# Patient Record
Sex: Male | Born: 1935 | Race: White | Hispanic: No | Marital: Married | Smoking: Former smoker
Health system: Southern US, Community
[De-identification: ages and names within clinical notes are randomized; demographics above are authoritative.]

## PROBLEM LIST (undated history)

## (undated) DIAGNOSIS — R202 Paresthesia of skin: Secondary | ICD-10-CM

## (undated) DIAGNOSIS — I129 Hypertensive chronic kidney disease with stage 1 through stage 4 chronic kidney disease, or unspecified chronic kidney disease: Secondary | ICD-10-CM

## (undated) DIAGNOSIS — I10 Essential (primary) hypertension: Secondary | ICD-10-CM

## (undated) DIAGNOSIS — N400 Enlarged prostate without lower urinary tract symptoms: Secondary | ICD-10-CM

## (undated) DIAGNOSIS — K279 Peptic ulcer, site unspecified, unspecified as acute or chronic, without hemorrhage or perforation: Secondary | ICD-10-CM

## (undated) DIAGNOSIS — N289 Disorder of kidney and ureter, unspecified: Secondary | ICD-10-CM

## (undated) DIAGNOSIS — I2510 Atherosclerotic heart disease of native coronary artery without angina pectoris: Secondary | ICD-10-CM

## (undated) DIAGNOSIS — E785 Hyperlipidemia, unspecified: Secondary | ICD-10-CM

## (undated) HISTORY — DX: Hypertensive chronic kidney disease with stage 1 through stage 4 chronic kidney disease, or unspecified chronic kidney disease: I12.9

## (undated) HISTORY — DX: Paresthesia of skin: R20.2

## (undated) HISTORY — DX: Atherosclerotic heart disease of native coronary artery without angina pectoris: I25.10

## (undated) HISTORY — DX: Peptic ulcer, site unspecified, unspecified as acute or chronic, without hemorrhage or perforation: K27.9

## (undated) HISTORY — DX: Disorder of kidney and ureter, unspecified: N28.9

## (undated) HISTORY — DX: Essential (primary) hypertension: I10

## (undated) HISTORY — DX: Hyperlipidemia, unspecified: E78.5

## (undated) HISTORY — DX: Benign prostatic hyperplasia without lower urinary tract symptoms: N40.0

---

## 2004-03-10 ENCOUNTER — Encounter: Admission: RE | Admit: 2004-03-10 | Discharge: 2004-03-10 | Payer: Self-pay | Admitting: Nephrology

## 2004-06-03 ENCOUNTER — Ambulatory Visit (HOSPITAL_COMMUNITY): Admission: RE | Admit: 2004-06-03 | Discharge: 2004-06-03 | Payer: Self-pay | Admitting: Nephrology

## 2005-04-02 ENCOUNTER — Ambulatory Visit: Payer: Self-pay | Admitting: Cardiology

## 2005-04-07 ENCOUNTER — Ambulatory Visit: Payer: Self-pay

## 2006-04-28 ENCOUNTER — Ambulatory Visit: Payer: Self-pay | Admitting: Cardiology

## 2006-09-22 ENCOUNTER — Ambulatory Visit: Payer: Self-pay | Admitting: Gastroenterology

## 2006-10-06 ENCOUNTER — Encounter (INDEPENDENT_AMBULATORY_CARE_PROVIDER_SITE_OTHER): Payer: Self-pay | Admitting: Specialist

## 2006-10-06 ENCOUNTER — Ambulatory Visit: Payer: Self-pay | Admitting: Gastroenterology

## 2006-10-20 ENCOUNTER — Ambulatory Visit: Payer: Self-pay | Admitting: Gastroenterology

## 2006-10-20 ENCOUNTER — Encounter (INDEPENDENT_AMBULATORY_CARE_PROVIDER_SITE_OTHER): Payer: Self-pay | Admitting: *Deleted

## 2006-11-09 ENCOUNTER — Ambulatory Visit: Payer: Self-pay | Admitting: Gastroenterology

## 2006-11-09 LAB — CONVERTED CEMR LAB: Folate: >20 ng/mL

## 2006-11-22 ENCOUNTER — Ambulatory Visit: Payer: Self-pay | Admitting: Gastroenterology

## 2006-11-22 LAB — CONVERTED CEMR LAB
OCCULT 2: NEGATIVE
OCCULT 3: NEGATIVE
OCCULT 4: NEGATIVE
OCCULT 5: NEGATIVE

## 2007-01-12 ENCOUNTER — Ambulatory Visit: Payer: Self-pay

## 2007-03-08 ENCOUNTER — Encounter (HOSPITAL_COMMUNITY): Admission: RE | Admit: 2007-03-08 | Discharge: 2007-05-30 | Payer: Self-pay | Admitting: Nephrology

## 2007-04-20 ENCOUNTER — Ambulatory Visit: Payer: Self-pay | Admitting: Cardiology

## 2007-07-05 ENCOUNTER — Ambulatory Visit (HOSPITAL_COMMUNITY): Admission: RE | Admit: 2007-07-05 | Discharge: 2007-07-05 | Payer: Self-pay | Admitting: Nephrology

## 2007-10-06 ENCOUNTER — Ambulatory Visit (HOSPITAL_COMMUNITY): Admission: RE | Admit: 2007-10-06 | Discharge: 2007-10-06 | Payer: Self-pay | Admitting: Nephrology

## 2007-10-25 ENCOUNTER — Ambulatory Visit: Payer: Self-pay | Admitting: Gastroenterology

## 2007-10-27 ENCOUNTER — Ambulatory Visit: Payer: Self-pay | Admitting: Gastroenterology

## 2008-04-11 ENCOUNTER — Ambulatory Visit (HOSPITAL_COMMUNITY): Admission: RE | Admit: 2008-04-11 | Discharge: 2008-04-11 | Payer: Self-pay | Admitting: Nephrology

## 2008-04-18 ENCOUNTER — Ambulatory Visit: Payer: Self-pay | Admitting: Cardiology

## 2008-04-19 ENCOUNTER — Ambulatory Visit: Payer: Self-pay

## 2008-05-10 ENCOUNTER — Ambulatory Visit (HOSPITAL_COMMUNITY): Admission: RE | Admit: 2008-05-10 | Discharge: 2008-05-10 | Payer: Self-pay | Admitting: Nephrology

## 2008-11-06 ENCOUNTER — Encounter (HOSPITAL_COMMUNITY): Admission: RE | Admit: 2008-11-06 | Discharge: 2009-02-04 | Payer: Self-pay | Admitting: Nephrology

## 2009-04-25 ENCOUNTER — Encounter: Payer: Self-pay | Admitting: Cardiology

## 2009-04-25 ENCOUNTER — Ambulatory Visit: Payer: Self-pay

## 2009-06-18 DIAGNOSIS — N259 Disorder resulting from impaired renal tubular function, unspecified: Secondary | ICD-10-CM | POA: Insufficient documentation

## 2009-06-18 DIAGNOSIS — I129 Hypertensive chronic kidney disease with stage 1 through stage 4 chronic kidney disease, or unspecified chronic kidney disease: Secondary | ICD-10-CM

## 2009-06-18 DIAGNOSIS — I10 Essential (primary) hypertension: Secondary | ICD-10-CM | POA: Insufficient documentation

## 2009-06-18 DIAGNOSIS — E785 Hyperlipidemia, unspecified: Secondary | ICD-10-CM

## 2009-06-18 DIAGNOSIS — K279 Peptic ulcer, site unspecified, unspecified as acute or chronic, without hemorrhage or perforation: Secondary | ICD-10-CM | POA: Insufficient documentation

## 2009-06-18 DIAGNOSIS — I2510 Atherosclerotic heart disease of native coronary artery without angina pectoris: Secondary | ICD-10-CM | POA: Insufficient documentation

## 2009-06-18 DIAGNOSIS — R209 Unspecified disturbances of skin sensation: Secondary | ICD-10-CM

## 2009-06-18 DIAGNOSIS — K922 Gastrointestinal hemorrhage, unspecified: Secondary | ICD-10-CM | POA: Insufficient documentation

## 2009-06-18 DIAGNOSIS — Z87898 Personal history of other specified conditions: Secondary | ICD-10-CM

## 2009-06-19 ENCOUNTER — Ambulatory Visit: Payer: Self-pay | Admitting: Cardiology

## 2009-06-20 ENCOUNTER — Ambulatory Visit (HOSPITAL_COMMUNITY): Admission: RE | Admit: 2009-06-20 | Discharge: 2009-06-20 | Payer: Self-pay | Admitting: Nephrology

## 2010-05-06 ENCOUNTER — Encounter: Payer: Self-pay | Admitting: Cardiology

## 2010-05-06 DIAGNOSIS — I6529 Occlusion and stenosis of unspecified carotid artery: Secondary | ICD-10-CM

## 2010-05-07 ENCOUNTER — Encounter: Payer: Self-pay | Admitting: Cardiology

## 2010-05-07 ENCOUNTER — Ambulatory Visit: Payer: Self-pay

## 2010-05-15 ENCOUNTER — Ambulatory Visit (HOSPITAL_COMMUNITY): Admission: RE | Admit: 2010-05-15 | Discharge: 2010-05-15 | Payer: Self-pay | Admitting: Nephrology

## 2010-11-20 NOTE — Miscellaneous (Signed)
Summary: Orders Update  Clinical Lists Changes  Problems: Added new problem of CAROTID ARTERY DISEASE (ICD-433.10) Orders: Added new Test order of Carotid Duplex (Carotid Duplex) - Signed 

## 2010-12-31 ENCOUNTER — Ambulatory Visit (HOSPITAL_COMMUNITY): Payer: Medicare Other | Attending: Nephrology

## 2010-12-31 DIAGNOSIS — D509 Iron deficiency anemia, unspecified: Secondary | ICD-10-CM | POA: Insufficient documentation

## 2011-02-14 ENCOUNTER — Encounter: Payer: Self-pay | Admitting: Cardiology

## 2011-02-18 ENCOUNTER — Ambulatory Visit (INDEPENDENT_AMBULATORY_CARE_PROVIDER_SITE_OTHER): Payer: Medicare Other | Admitting: Cardiology

## 2011-02-18 ENCOUNTER — Encounter: Payer: Self-pay | Admitting: Cardiology

## 2011-02-18 VITALS — BP 121/61 | HR 64 | Ht 66.0 in | Wt 184.0 lb

## 2011-02-18 DIAGNOSIS — E785 Hyperlipidemia, unspecified: Secondary | ICD-10-CM

## 2011-02-18 DIAGNOSIS — I6529 Occlusion and stenosis of unspecified carotid artery: Secondary | ICD-10-CM

## 2011-02-18 DIAGNOSIS — I2510 Atherosclerotic heart disease of native coronary artery without angina pectoris: Secondary | ICD-10-CM

## 2011-02-18 DIAGNOSIS — I10 Essential (primary) hypertension: Secondary | ICD-10-CM

## 2011-02-18 DIAGNOSIS — N259 Disorder resulting from impaired renal tubular function, unspecified: Secondary | ICD-10-CM

## 2011-02-18 MED ORDER — ATORVASTATIN CALCIUM 40 MG PO TABS: 80.0000 mg | ORAL_TABLET | Freq: Every day | ORAL | Status: DC

## 2011-02-18 NOTE — Progress Notes (Signed)
HPI The patient has been doing well since I last saw him. He remains active doing yard work. With this level of activity he denies any chest discomfort, neck or arm discomfort. He has had no palpitations, presyncope or syncope. He denies any shortness of breath, PND or orthopnea. He has had no weight gain or edema. He has had no cough fevers or chills. I do note that he was told by Dr. Christell Constant to increase his Lipitor recently but he didn't do this as he needed a new prescription. I reviewed his lipid profile.  No Known Allergies  Current Outpatient Prescriptions  Medication Sig Dispense Refill  . aspirin 81 MG tablet Take 81 mg by mouth daily.        . Calcium Carbonate-Vitamin D (CALCIUM + D PO) Take by mouth. 1 TAB DAILY  (500MG )       . Epoetin Alfa (PROCRIT IJ) Inject as directed. 1 SHOT EVERY EVERY 2 - 3 WEEKS       . furosemide (LASIX) 40 MG tablet Take 40 mg by mouth 2 (two) times daily.        Marland Kitchen allopurinol (ZYLOPRIM) 100 MG tablet 1 TAB TWICE A DAY      . atorvastatin (LIPITOR) 40 MG tablet 1 TAB QD      . calcitRIOL (ROCALTROL) 0.5 MCG capsule 1 TAB EVERY OTHER DAY      . metoprolol (TOPROL-XL) 50 MG 24 hr tablet 1/2 TAB TWICE A DAY      . PRILOSEC OTC 20 MG tablet 2 TABS DAILY        Past Medical History  Diagnosis Date  . Paresthesia   . Benign prostatic hypertrophy   . Hypertension   . Dyslipidemia   . Renal insufficiency   . Nephrosclerosis   . Gastrointestinal hemorrhage   . PUD (peptic ulcer disease)   . CAD (coronary artery disease)     (status post PTCA of the LAD and right coronary artery by Dr. Earl Gala.  He had a repeatcatheterization in 1984 by Dr. Juanda Chance.  In 1995, he was found to have anoccluded LAD with 50-70% proximal circumflex stenosis.  He had 50%,followed by 60%, followed by 50% right coronary artery stenosis.  His EF was 50%)    No past surgical history on file.  ROS:  As stated in the HPI and negative for all other systems.  PHYSICAL EXAM BP  121/61  Pulse 64  Ht 5\' 6"  (1.676 m)  Wt 184 lb (83.462 kg)  BMI 29.70 kg/m2 GENERAL:  Well appearing HEENT:  Pupils equal round and reactive, fundi not visualized, oral mucosa unremarkable, poor dentition NECK:  No jugular venous distention, waveform within normal limits, carotid upstroke brisk and symmetric, right bruit, no thyromegaly LYMPHATICS:  No cervical, inguinal adenopathy LUNGS:  Clear to auscultation bilaterally BACK:  No CVA tenderness CHEST:  Unremarkable HEART:  PMI not displaced or sustained,S1 and S2 within normal limits, no S3, no S4, no clicks, no rubs, soft apical systolic murmur ABD:  Flat, positive bowel sounds normal in frequency in pitch, no bruits, no rebound, no guarding, no midline pulsatile mass, no hepatomegaly, no splenomegaly EXT:  2 plus pulses throughout, no edema, no cyanosis no clubbing SKIN:  No rashes no nodules NEURO:  Cranial nerves II through XII grossly intact, motor grossly intact throughout PSYCH:  Cognitively intact, oriented to person place and time   EKG:  Sinus rhythm, rate 64, right bundle branch block, no change from previous  ASSESSMENT  AND PLAN

## 2011-02-18 NOTE — Assessment & Plan Note (Signed)
I reviewed his creatinine which was 2.16 recently. She is followed by nephrology. This is relatively stable slightly increased.

## 2011-02-18 NOTE — Assessment & Plan Note (Signed)
I did review his lipids. He is to increase his Lipitor to 80 mg Monday Wednesday and Friday and 40 mg the other days.

## 2011-02-18 NOTE — Assessment & Plan Note (Signed)
He will have a followup carotid Dopplers in July.

## 2011-02-18 NOTE — Assessment & Plan Note (Signed)
Though it has been many years since his catheterization and his last stress test was in 2006 I do not plan on repeat testing as he is completely asymptomatic. With his renal insufficiency we would like to avoid any invasive evaluation and I will be guided by symptoms in further determining the need for stress testing or catheterization in the future. He will of course continue aggressive risk reduction.

## 2011-02-18 NOTE — Assessment & Plan Note (Signed)
The blood pressure is at target. No change in medications is indicated. We will continue with therapeutic lifestyle changes (TLC).  

## 2011-02-18 NOTE — Patient Instructions (Signed)
Increase Lipitor to 80 mg on Monday, Wednesday and Friday 40 mg all other days Follow up with Dr Antoine Poche in [place] in 18 months Continue all other medications as ordered

## 2011-03-03 NOTE — Assessment & Plan Note (Signed)
[facility]                            CARDIOLOGY OFFICE NOTE   ALMANDO, BRAWLEY                      MRN:          [id number]  DATE:04/18/2008                            DOB:          1925-08-31    PRIMARY CARE PHYSICIAN:  Ernestina Penna, MD.   REASON FOR PRESENTATION:  Evaluate patient with coronary disease.   HISTORY OF PRESENT ILLNESS:  The patient just turned 77 years old.  He  has done well since I last saw him.  He was thinking about having his  shoulder operated on for rotator cuff repair, but he decided not to do  this.  He does have progressive renal insufficiency and is seeing Dr.  Darrick Penna.  He has had no cardiovascular complaints.  He remains  somewhat active and denies any chest pressure or neck or arm discomfort.  He has had no palpitations, presyncope, or syncope.   PAST MEDICAL HISTORY:  1. Coronary artery disease (status post PTCA of the LAD and right      coronary artery by Dr. Earl Gala.  He had repeat catheterization      in 1984 by Dr. Juanda Chance demonstrating 30% right coronary stenosis and      recurrent LAD 90% stenosis.  He was managed medically.      Catheterization in 1995 demonstrated an occluded LAD with 50-70%      proximal circumflex stenosis.  There was 50% followed by 60%      followed by 50% right coronary artery stenosis.  The EF was 60%).  2. Peptic ulcer disease.  3. Gastrointestinal bleeding.  4. Nephrosclerosis with progressive renal insufficiency.  5. Hyperlipidemia.  6. Hypertension.  7. Osteoporosis.  8. Kyphoscoliosis.  9. Benign prostatic hypertrophy.  10.Paresthesias.   ALLERGIES:  None.   MEDICATIONS:  1. Niaspan 500 mg t.i.d.  2. Aspirin 81 mg daily.  3. Calcium.  4. Prilosec 20 mg b.i.d.  5. Calcitriol.  6. Aranesp.  7. Allopurinol 100 mg b.i.d.  8. Procrit.  9. Metoprolol 50 mg daily.  10.Lipitor 40 mg daily.  11.Furosemide 40 mg b.i.d.  12.Klor-Con 10 mEq daily.   REVIEW OF  SYSTEMS:  As stated in the HPI and otherwise negative for  other systems.   PHYSICAL EXAMINATION:  The patient is in no distress.  Blood pressure 118/64, heart rate 78 and regular, weight 177 pounds, and  body mass index 26.  HEENT:  Eyes are unremarkable.  Pupils are equal, round, and reactive to  light.  Fundi not visualized.  Oral mucosa unremarkable.  NECK:  Bilateral right-greater-than-left carotid bruits, no thyromegaly.  LYMPHATICS:  No adenopathy.  LUNGS:  Clear to auscultation bilaterally.  BACK:  Kyphoscoliosis.  HEART:  PMI not displaced or sustained, S1 and S2 within normal limits.  No S3.  No S4.  No clicks, no rubs, and no murmurs.  ABDOMEN:  Flat, positive bowel sounds, normal in frequency and pitch, no  bruits, no rebound, and no guarding.  No midline pulsatile mass and no  organomegaly.  SKIN:  No rashes, no nodules.  EXTREMITIES:  Pulses 2+  throughout, trace bilateral lower extremity  edema.  No cyanosis, no clubbing.  NEURO: Grossly intact throughout.   EKG:  Sinus rhythm, rate 78, right axis deviation, right bundle-branch  block, no acute ST-T wave changes, no change from previous EKGs.   ASSESSMENT AND PLAN:  1. Coronary artery disease.  The patient has had no new symptoms.  He      has had his last stress perfusion study in 2006.  At this point, I      would not pursue another study in the absence of symptoms.  I would      have to be hard pressed to consider catheterization given his renal      insufficiency.  Rather, we will continue to pursue aggressive      secondary risk reduction.  2. Carotid stenosis.  The patient has some nonobstructive carotid      stenosis and is due for repeat carotid Doppler, and I will arrange      this.  3. Hypertension.  Blood pressure is very well controlled, and he can      continue on the medications as listed.  4. Dyslipidemia, had a reasonable lipid profile, followed by Dr.      Christell Constant.  5. Renal insufficiency.  He is  followed by Dr. Darrick Penna.   FOLLOWUP:  I will see him back in 1 year or sooner if needed.     Rollene Rotunda, MD, [facility]  Electronically Signed   JH/MedQ  DD: 04/18/2008  DT: 04/19/2008  Job #: [id number]   cc:   Ernestina Penna, M.D.

## 2011-03-03 NOTE — Assessment & Plan Note (Signed)
[facility]                         GASTROENTEROLOGY OFFICE NOTE   Aaron Hahn, Aaron Hahn                      MRN:          [id number]  DATE:10/25/2007                            DOB:          [birth year]    Aaron Hahn is an 77 year old white male with chronic renal  insufficiency followed by Dr. Darrick Penna and Dr. Vernon Prey.  He has  chronic normochromic normocytic anemia associated with his chronic renal  insufficiency, but also has repeatedly guaiac positive stools.  He is on  weekly Aranesp injections, Procrit 4000 mg a week, and oral iron  supplements in addition to Prilosec 20 mg twice a day.   He has had multiple endoscopic exams and has had recurrent peptic ulcer  disease and duodenitis, and is chronic on PPI therapy.  It is of note  that he continues to take an 81 mg aspirin tablet.  Last endoscopy and  colonoscopy in January 2008 and were otherwise unremarkable, except for  some small incidental polyps and some diverticulosis, and also internal  hemorrhoids.  The patient will occasionally have some bright red blood  if he wipes too hard.   Denies abdominal pain, dyspepsia, melena, or hematochezia, anorexia,  weight loss, or other medical problems.  Recent hemoglobin in Dr.  Deterding's office was 11.2, and he had a serum ferritin of 54, and iron  saturation of 15%.  Electrolyte profile was normal.   Weight 180 pounds, blood pressure 134/64, pulse 76 and regular.  He is an elderly-appearing white male in no acute distress.  Appearing  older than stated age.  I could not appreciate stigmata of chronic liver disease.  ABDOMEN:  No hepatosplenomegaly, abdominal masses, or tenderness.  Bowel  sounds were normal.  Inspection of the rectum was unremarkable without fissures, fistulae, or  hemorrhoids.  Rectal exam showed no masses or tenderness with soft brown  stools.  Guaiac negative.   ASSESSMENT:  Aaron Hahn has intermittently positive  stools, probably  related to his salicylate use.  He certainly has had no major  significant GI bleeding in the last several years despite taking aspirin  therapy.  His workup has been complete, except for small bowel  examination.   RECOMMENDATIONS:  I have gone ahead and set Aaron Hahn up for entero-  capsule camera exam of his gut.  We will continue other medications in  the interim and proceed accordingly.     Vania Rea. Jarold Motto, MD, [facility], FAGA  Electronically Signed    DRP/MedQ  DD: 10/25/2007  DT: 10/25/2007  Job #: [id number]   cc:   Fayrene Fearing L. Deterding, M.D.  Ernestina Penna, M.D.

## 2011-03-03 NOTE — Assessment & Plan Note (Signed)
[facility]                            CARDIOLOGY OFFICE NOTE   CAYLOR, TALLARICO                      MRN:          [id number]  DATE:04/20/2007                            DOB:          [birth year]    PRIMARY:  Ernestina Penna, M.D.   REASON FOR PRESENTATION:  Evaluate the patient with coronary artery  disease.   HISTORY OF PRESENT ILLNESS:  The patient is now 77 years old.  He  presents for yearly followup.  Since I last saw him, he has done  relatively well.  His renal function has been relatively stable and  followed quarterly by Dr. Darrick Penna.  He has had problems with his right  shoulder and is unable to lift his arm.  This is his biggest complaint.  Despite this, he remains active, doing such things as yard work, riding  a Surveyor, mining, and Radiographer, therapeutic for trim.  With this, he denies any  chest discomfort, neck discomfort, arm discomfort, activity-induced  nausea, vomiting, or excessive diaphoresis.  He has had no palpitations,  pre-syncope, or syncope.  He has had no PND or orthopnea.   PAST MEDICAL HISTORY:  Coronary artery disease status post PTCA of the  LAD and right coronary artery with Dr. Earl Gala.  Repeat  catheterization in 1984 by Dr. Juanda Chance demonstrated 30% right stenosis,  recurrent 90% stenosis of the LAD.  He was managed medically.  Catheterization in 1995 demonstrated an occluded LAD 50-70% proximal  circumflex stenosis, 50% followed by 60% followed by 50% right coronary  artery stenosis.  His EF was 60%.  Peptic ulcer disease.  Gastrointestinal bleeding.  Nephrosclerosis with progressive renal  insufficiency.  Hyperlipidemia.  Hypertension.  Osteoporosis.  Kyphoscoliosis.  Benign prostatic hypertrophy.  Paresthesias.   ALLERGIES:  None.   CURRENT MEDICATIONS:  1. Metoprolol 50 mg daily.  2. Niaspan 500 mg t.i.d.  3. Lovastatin 40 mg daily.  4. Aspirin 81 mg daily.  5. Iron 225 mg daily.  6. Furosemide 40 mg  daily.  7. Calcium.  8. Prilosec.  9. Calcitriol.  10.Aranesp every other week.   REVIEW OF SYSTEMS:  As stated in the HPI and otherwise negative for  other systems.   PHYSICAL EXAMINATION:  The patient is in no distress.  Blood pressure 132/78, heart rate 66 and regular, weight 175 pounds.  Body mass index 26.  HEENT:  Eyelids unremarkable.  Pupils are equal, round, and reactive to  light and accommodation.  Fundi not visualized.  Oral mucosa  unremarkable.  NECK:  No jugular venous distension, wave form within normal limits,  carotid upstroke brisk and symmetric, right greater than left carotid  bruit, no thyromegaly.  LYMPHATICS:  No cervical, axillary, or inguinal adenopathy.  LUNGS:  Clear to auscultation bilaterally.  BACK:  No costovertebral angle tenderness.  CHEST:  Unremarkable.  HEART:  PMI not displaced or sustained, S1 and S2 within normal limits,  no S3, no S4, no clicks, rubs, murmurs.  ABDOMEN:  Mildly obese, positive bowel sounds, normal in frequency and  pitch, no bruits, rebound, guarding.  No  midline pulsatile masses,  hepatomegaly, splenomegaly.  SKIN:  No rashes, no nodules.  EXTREMITIES:  With 2+ pulses upper pulses, 2+ femorals with right  greater than left bruit, absent dorsalis pedis and posterior tibialis on  the right, 1+ dorsalis and posterior tibialis on the left, no edema,  cyanosis, clubbing.  NEURO:  Oriented to person, place, and time, cranial nerves 2-12 grossly  intact, motor grossly intact.   ELECTROCARDIOGRAM:  Sinus rhythm.  Right bundle branch block, complete  (was incomplete at the previous EKG).  Inferolateral T wave inversions.  Possible repolarization versus ischemia new compared to previous.   ASSESSMENT AND PLAN:  1. Coronary artery disease.  The patient has extensive coronary artery      disease as described.  However, he is having no symptoms and      remains active.  He has known occluded left anterior descending.  A       stress perfusion study, I think, would be less helpful in this      situation, as it is going to be abnormal.  I think he would really      have to be symptomatic before I would suggest catheterization given      his degree of renal insufficiency.  Therefore, I would continue      with aggressive secondary risk reduction and medical management.  I      would have a low threshold for further evaluation, though, if he      does have symptoms in the future.  He understands this.  2. Hypertension.  Blood pressure is well controlled and he will      continue the medications as listed.  3. Dyslipidemia is followed closely by Dr. Christell Constant.  4. Renal insufficiency followed by Dr. Darrick Penna.  5. Followup.  I will see the patient in 1 year or sooner if needed.     Rollene Rotunda, MD, [facility]  Electronically Signed    JH/MedQ  DD: 04/20/2007  DT: 04/20/2007  Job #: [id number]

## 2011-03-03 NOTE — Assessment & Plan Note (Signed)
[facility]                            CARDIOLOGY OFFICE NOTE   Aaron Hahn, Aaron Hahn                      MRN:          [id number]  DATE:06/19/2009                            DOB:          1924/11/28    PRIMARY PHYSICIAN:  Ernestina Penna, MD   REASON FOR PRESENTATION:  Evaluate the patient with coronary disease.   HISTORY OF PRESENT ILLNESS:  The patient returns for yearly followup.  Since I last saw him, he has had no new cardiovascular problems.  He  does some activities though he is not exercising.  He does his yard work  riding a Firefighter.  With this in his daily chores, he does not develop any  chest discomfort, neck, or arm discomfort.  He does not report any new  shortness of breath and has no PND or orthopnea.  He has no  palpitations, presyncope, or syncope.  He does have a stable renal  insufficiency and is followed by Dr. Darrick Penna.  He has his cholesterol  followed by Dr. Christell Constant.   PAST MEDICAL HISTORY:  Coronary artery disease (status post PTCA of the  LAD and right coronary artery by Dr. Earl Gala.  He had a repeat  catheterization in 1984 by Dr. Juanda Chance.  In 1995, he was found to have an  occluded LAD with 50-70% proximal circumflex stenosis.  He had 50%,  followed by 60%, followed by 50% right coronary artery stenosis.  His EF  was 60%), peptic ulcer disease, chronic renal insufficiency,  gastrointestinal bleeding, hyperlipidemia, hypertension, osteoporosis,  kyphoscoliosis, benign prostatic hypertrophy, paresthesias, skin cancer  resected, nonobstructive carotid artery stenosis.   ALLERGIES:  None.   MEDICATIONS:  1. Potassium 10 mEq daily.  2. Fish oil.  3. Garlic.  4. Procrit.  5. Metoprolol 25 mg b.i.d.  6. Furosemide 40 mg b.i.d.  7. Lipitor 40 mg daily.  8. Allopurinol 100 mg b.i.d.  9. Calcitriol.  10.Prilosec 20 mg b.i.d.  11.Calcium.  12.Aspirin 81 mg daily.   REVIEW OF SYSTEMS:  As stated in the HPI, and  otherwise negative for all  other systems.   PHYSICAL EXAMINATION:  GENERAL:  The patient is in no distress.  VITAL SIGNS:  Blood pressure 116/60, heart rate 60 and regular, weight  192 pounds, body mass index 23.  HEENT:  Eyes are unremarkable, pupils equal, round, and reactive to  light, fundi not visualized, oral mucosa unremarkable.  NECK:  Bilateral right greater than left carotid bruits, no thyromegaly.  LYMPHATICS:  No cervical, axillary, or inguinal adenopathy.  LUNGS:  Clear to auscultation bilaterally.  BACK:  No costovertebral angle tenderness.  CHEST:  Unremarkable.  HEART:  PMI not displaced or sustained.  S1 and S2 within normal limits.  No S3, no S4, 2/6 apical systolic murmur radiating slightly at the  aortic outflow tract and heard at the upper sternal borders bilaterally,  no diastolic murmurs.  ABDOMEN:  Mildly obese, positive bowel sounds, normal in frequency and  pitch, no bruits, no rebound, no guarding, no midline pulsatile mass, no  hepatomegaly, no splenomegaly.  SKIN:  No  rashes, no nodules.  EXTREMITIES:  2+ pulse throughout, no edema, no cyanosis, or clubbing.  NEURO:  Oriented to birth, place and time.  Cranial nerves II through  XII grossly intact, motor grossly intact.    EKG sinus rhythm, right bundle-branch block, no acute ST-T wave changes,  no change from previous EKGs.   ASSESSMENT AND PLAN:  1. Coronary disease.  The patient has no new symptoms.  He will      continue to be managed with risk reduction and antianginals.  No      further cardiovascular testing is suggested unless he develops      symptoms in the future.  2. Hypertension.  Blood pressure is controlled.  He will continue the      meds as listed.  He actually apparently was somewhat hypotensive      and had a reduction in his Toprol and says he has done very well      with this.  3. Dyslipidemia per Dr. Christell Constant with a goal LDL less than 100 and HDL      greater than 40.  4.  Carotid stenosis.  He had some nonobstructive plaque and gets      yearly followup and is up-to-date on this.  5. Followup.  I will see him back in about 18 months or sooner if      needed.     Rollene Rotunda, MD, [facility]  Electronically Signed    JH/MedQ  DD: 06/19/2009  DT: 06/20/2009  Job #: [id number]   cc:   Ernestina Penna, M.D.

## 2011-03-06 NOTE — Assessment & Plan Note (Signed)
[facility]                              CARDIOLOGY OFFICE NOTE   ATLEE, KLUTH                        MRN:          [id number]  DATE:  04/28/2006                              DOB:      January 23, 1925    PRIMARY CARE PHYSICIAN:  Ernestina Penna, M.D.   REASON FOR PRESENTATION:  Evaluate the patient for coronary disease.   HISTORY OF PRESENT ILLNESS:  The patient is a pleasant 77 year old gentleman  with coronary disease as described below.  He presents for yearly follow-up.  He has done well from a cardiovascular standpoint since I last saw him.  He  has had a recent rise in his creatinine to 2.6 from 1.5.  He is having this  followed closely by Dr. Darrick Penna who recently reduced his ACE inhibitor.  He is also having his blood pressure followed by Dr. Darrick Penna and Dr. Christell Constant  as well.  His blood pressure has been well-controlled though slightly  elevated today.   The patient denies any cardiovascular symptoms.  He says he can work in his  garden without any chest discomfort, neck discomfort, arm discomfort,  activity induced nausea, vomiting, excessive diaphoresis.  He has had no  palpitations, presyncope, or syncope.  Of note he has never had any  significant anginal symptoms.  Because of this I did send him for a stress  profusion study last year and he had an EF of 56% with no high grade  ischemia.  There was an apical infarct with mild peri-infarct ischemia.   PAST MEDICAL HISTORY:  1.  Coronary artery disease, status post PTCA, of the LAD and right coronary      artery by Dr. Earl Gala.  Repeat catheterization, 1984, by Dr. Juanda Chance      demonstrated 30% right stenosis, recurrent 90% stenosis of the LAD.  He      was managed medically.  Catheterization in 1995 demonstrated an occluded      LAD, 50-70% proximal circumflex stenosis, 50% followed by 60% followed      by 50% right coronary artery stenosis.  The EF was 60%.  2.  Peptic ulcer  disease.  3.  Gastrointestinal bleeding.  4.  Nephrosclerosis with progressive renal insufficiency.  5.  Hyperlipidemia.  6.  Hypertension.  7.  Osteoporosis.  8.  Kyphoscoliosis.  9.  Benign prostatic hypertrophy.  10. Paresthesias.   ALLERGIES:  None.   MEDICATIONS:  1.  Aspirin 81 mg three times per week.  2.  Niaspan 1500 mg daily.  3.  Lovastatin 40 mg daily.  4.  Iron 325 mg daily.  5.  Lisinopril 20 mg daily.  6.  Toprol XL 75 mg daily.  7.  Calcium.  8.  Lasix.   REVIEW OF SYSTEMS:  As stated in the HPI, otherwise negative for other  systems.   PHYSICAL EXAMINATION:  GENERAL:  The patient is in no distress.  VITAL SIGNS:  Blood pressure 153/79, heart rate 63 and regular, weight 180  pounds, body mass index 27.  HEENT:  Eyelids unremarkable.  Pupils equal, round, and reactive  to light.  Fundi not well-visualized.  Oral mucosa unremarkable.  NECK:  Bilateral right greater than left carotid bruits.  No thyromegaly.  LYMPHATICS:  No cervical, axillary, or inguinal adenopathy.  LUNGS:  Clear to auscultation bilaterally.  BACK:  Severe kyphoscoliosis.  HEART:  PMI not displaced or sustained.  S1 and S2 within normal limits.  No  S3, no S4, no murmurs.  ABDOMEN:  Flat, positive bowel sounds.  Normal in frequency and pitch.  No  bruits, rebound, guarding, midline pulsatile mass, or organomegaly.  SKIN:  No rashes, __________.  EXTREMITIES:  2+ pulses throughout.  Trace bilateral lower extremity edema.  No cyanosis, clubbing.  NEUROLOGIC:  Oriented to person, place, time.  Cranial nerves II-XII grossly  intact.  Motor grossly intact.   LABORATORY DATA:  EKG:  Sinus rhythm.  Rate 63.  Axis within normal limits.  RSR prime V1, early transition, no acute ST/T-wave changes.   ASSESSMENT/PLAN:  1.  Coronary disease.  The patient has no ongoing symptoms.  He had a      negative stress profusion study last year.  No further cardiovascular      testing is suggested.  He will  continue with aggressive secondary risk      reduction.  2.  Hypertension.  Blood pressure is slightly elevated today.  However, I      went back to Dr. Kathi Der records and do not see any other elevated blood      pressure readings recently.  He is having this followed closely by Dr.      Darrick Penna and is due to see him soon.  Dr. Darrick Penna has adjusted his      ACE inhibitor based on his renal insufficiency and his blood pressure      and I will defer further management.  3.  Renal insufficiency as above.  4.  Risk reduction.  He is having aggressive management of his lipids by Dr.      Christell Constant.  The goal      is an LDL less than 100 and HDL in the 40s.  5.  Follow-up.  I will see him back in one year or sooner if needed.                               Rollene Rotunda, MD, [facility]    JH/MedQ  DD:  04/28/2006  DT:  04/28/2006  Job #:  [id number]   cc:   Ernestina Penna, MD  Llana Aliment Deterding, MD

## 2011-03-06 NOTE — Assessment & Plan Note (Signed)
[facility]                         GASTROENTEROLOGY OFFICE NOTE   PRAISE, DOLECKI                      MRN:          [id number]  DATE:11/09/2006                            DOB:          23-Feb-1925    SUBJECTIVE:  Mr.  Aaron Hahn is a 77 year old white male who has  nephrosclerosis, associated with his hypertension, with mild chronic  renal insufficiency, followed by Dr. Fayrene Fearing L. Deterding.  He also has  coronary artery disease, hyperlipidemia and peripheral vascular disease.  He has had previous angioplasties on several occasions.  The patient has  a long history of iron deficiency, of unexplained etiology.  He was  referred to me for endoscopy and colonoscopy on October 25, 2006, as  direct screening patient.  His endoscopy was unremarkable except for a  scarred duodenal bulb.  He had old peptic ulcer disease.  Exam for  Helicobacter pylori was negative.  The colonoscopy also was performed.  He had a small adenomatous polyp removed in the ascending colon with  scattered diverticulosis and prominent non-bleeding hemorrhoids.  He was  placed on Prevacid 30 mg daily, but his insurance company refused to pay  for such, and he currently is taking Prilosec 20 mg b.i.d.  He denies  any GI complaints at this time.   MEDICATIONS:  1. He takes __________  10 mg a day.  2. Metoprolol 50 mg a day.  3. Niaspan 500 mg t.i.d.  4. Lovastatin 40 mg a day.  5. Aspirin 81 mg a day.  6. Iron 325 mg a day.  7. Furosemide 40 mg a day.  8. Calcium with vitamin D 500 mg a day.  9. Prilosec b.i.d.  10.Calcitriol 0.5 mcg daily.   FAMILY HISTORY:  Noncontributory.   SOCIAL HISTORY:  He is married and lives with his wife.  He is currently  retired.  He used to smoke but has not done so in 30 years.  He does not  abuse ethanol.   REVIEW OF SYSTEMS:  Noncontributory.   PHYSICAL EXAMINATION:  VITAL SIGNS:  He is 5 feet 8 inches tall, weighs  184 pounds.  His blood  pressure is 140/78, pulse 68 and regular.  GENERAL:  He is an elderly white male, in no acute distress.  HEENT:  I could not appreciate stigmata of chronic liver disease.  The general physical examination is not repeated at this time.   ASSESSMENT/PLAN:  I have no documentation that Mr. Lantry really has  had guaiac-positive stools.  He probably has a mild anemia, related to  his chronic renal insufficiency, and I am not sure that he needs to be  on chronic iron therapy.  His endoscopy did show evidence of previous  peptic ulcer disease, and he is on aspirin.  I would recommend chronic  PPI therapy.   RECOMMENDATIONS:  1. Repeat Hemoccult cards.  2. Iron studies.  3. GI followup as needed.     Vania Rea. Jarold Motto, MD, [facility] Essex, FAGA  Electronically Signed    DRP/MedQ  DD: 11/09/2006  DT: 11/09/2006  Job #: [id number]   cc:  Ernestina Penna, M.D.  James L. Deterding, M.D.

## 2011-05-12 ENCOUNTER — Other Ambulatory Visit: Payer: Self-pay | Admitting: Cardiology

## 2011-05-12 DIAGNOSIS — I6529 Occlusion and stenosis of unspecified carotid artery: Secondary | ICD-10-CM

## 2011-05-13 ENCOUNTER — Encounter (INDEPENDENT_AMBULATORY_CARE_PROVIDER_SITE_OTHER): Payer: Medicare Other | Admitting: Cardiology

## 2011-05-13 DIAGNOSIS — I6529 Occlusion and stenosis of unspecified carotid artery: Secondary | ICD-10-CM

## 2011-05-18 ENCOUNTER — Encounter: Payer: Self-pay | Admitting: Cardiology

## 2011-07-03 ENCOUNTER — Encounter: Payer: Self-pay | Admitting: Cardiology

## 2011-07-08 ENCOUNTER — Ambulatory Visit (HOSPITAL_COMMUNITY): Payer: Medicare Other | Attending: Nephrology

## 2011-07-08 DIAGNOSIS — D509 Iron deficiency anemia, unspecified: Secondary | ICD-10-CM | POA: Insufficient documentation

## 2011-07-09 ENCOUNTER — Encounter: Payer: Self-pay | Admitting: Cardiology

## 2011-11-25 ENCOUNTER — Encounter: Payer: Self-pay | Admitting: Cardiology

## 2012-03-07 ENCOUNTER — Other Ambulatory Visit (HOSPITAL_COMMUNITY): Payer: Self-pay | Admitting: *Deleted

## 2012-03-15 ENCOUNTER — Encounter (HOSPITAL_COMMUNITY)
Admission: RE | Admit: 2012-03-15 | Discharge: 2012-03-15 | Disposition: A | Discharge status: Home / Self Care | Payer: Medicare Other | Source: Ambulatory Visit | Attending: Nephrology | Admitting: Nephrology

## 2012-03-15 DIAGNOSIS — N184 Chronic kidney disease, stage 4 (severe): Secondary | ICD-10-CM | POA: Insufficient documentation

## 2012-03-15 DIAGNOSIS — D638 Anemia in other chronic diseases classified elsewhere: Secondary | ICD-10-CM | POA: Insufficient documentation

## 2012-03-15 MED ORDER — EPOETIN ALFA 40000 UNIT/ML IJ SOLN
40000.0000 [IU] | INTRAMUSCULAR | Status: DC
  Administered 2012-03-15: 40000 [IU] via SUBCUTANEOUS
  Filled 2012-03-15: qty 1

## 2012-04-12 ENCOUNTER — Encounter (HOSPITAL_COMMUNITY)
Admission: RE | Admit: 2012-04-12 | Discharge: 2012-04-12 | Disposition: A | Discharge status: Home / Self Care | Payer: Medicare Other | Source: Ambulatory Visit | Attending: Nephrology | Admitting: Nephrology

## 2012-04-12 DIAGNOSIS — D638 Anemia in other chronic diseases classified elsewhere: Secondary | ICD-10-CM | POA: Insufficient documentation

## 2012-04-12 DIAGNOSIS — N184 Chronic kidney disease, stage 4 (severe): Secondary | ICD-10-CM | POA: Insufficient documentation

## 2012-04-12 LAB — IRON AND TIBC
Iron: 53 ug/dL (ref 42–135)
Saturation Ratios: 15 % — ABNORMAL LOW (ref 20–55)

## 2012-04-12 MED ORDER — EPOETIN ALFA 40000 UNIT/ML IJ SOLN
40000.0000 [IU] | INTRAMUSCULAR | Status: DC
  Administered 2012-04-12: 40000 [IU] via SUBCUTANEOUS

## 2012-04-12 MED ORDER — EPOETIN ALFA 40000 UNIT/ML IJ SOLN
INTRAMUSCULAR | Status: AC
  Administered 2012-04-12: 40000 [IU] via SUBCUTANEOUS
  Filled 2012-04-12: qty 1

## 2012-04-19 ENCOUNTER — Other Ambulatory Visit: Payer: Self-pay

## 2012-04-19 DIAGNOSIS — E785 Hyperlipidemia, unspecified: Secondary | ICD-10-CM

## 2012-04-19 MED ORDER — ATORVASTATIN CALCIUM 80 MG PO TABS: 80.0000 mg | ORAL_TABLET | Freq: Every day | ORAL | Status: DC

## 2012-04-19 NOTE — Telephone Encounter (Signed)
..   Requested Prescriptions   Signed Prescriptions Disp Refills  . atorvastatin (LIPITOR) 80 MG tablet 45 tablet 6    Sig: Take 1 tablet (80 mg total) by mouth daily. Take 1 tablet on Monday, Wednesday and Friday and take 1/2 tablet on all other days    Authorizing Provider: Rollene Rotunda    Ordering User: Christophor Eick M  Change dose to 80 mg per Ellwood Sayers

## 2012-04-27 ENCOUNTER — Other Ambulatory Visit (HOSPITAL_COMMUNITY): Payer: Self-pay | Admitting: *Deleted

## 2012-04-28 ENCOUNTER — Encounter (HOSPITAL_COMMUNITY)
Admission: RE | Admit: 2012-04-28 | Discharge: 2012-04-28 | Disposition: A | Discharge status: Home / Self Care | Payer: Medicare Other | Source: Ambulatory Visit | Attending: Nephrology | Admitting: Nephrology

## 2012-04-28 DIAGNOSIS — N184 Chronic kidney disease, stage 4 (severe): Secondary | ICD-10-CM | POA: Insufficient documentation

## 2012-04-28 DIAGNOSIS — D638 Anemia in other chronic diseases classified elsewhere: Secondary | ICD-10-CM | POA: Insufficient documentation

## 2012-04-28 MED ORDER — SODIUM CHLORIDE 0.9 % IV SOLN
Freq: Once | INTRAVENOUS | Status: AC
  Administered 2012-04-28: 11:00:00 via INTRAVENOUS

## 2012-04-28 MED ORDER — FERUMOXYTOL INJECTION 510 MG/17 ML
510.0000 mg | Freq: Once | INTRAVENOUS | Status: AC
  Administered 2012-04-28: 510 mg via INTRAVENOUS
  Filled 2012-04-28: qty 17

## 2012-05-10 ENCOUNTER — Encounter (HOSPITAL_COMMUNITY)
Admission: RE | Admit: 2012-05-10 | Discharge: 2012-05-10 | Disposition: A | Discharge status: Home / Self Care | Payer: Medicare Other | Source: Ambulatory Visit | Attending: Nephrology | Admitting: Nephrology

## 2012-05-10 LAB — POCT HEMOGLOBIN-HEMACUE: Hemoglobin: 11.1 g/dL — ABNORMAL LOW (ref 13.0–17.0)

## 2012-05-10 LAB — IRON AND TIBC: Saturation Ratios: 42 % (ref 20–55)

## 2012-05-10 MED ORDER — EPOETIN ALFA 40000 UNIT/ML IJ SOLN
40000.0000 [IU] | INTRAMUSCULAR | Status: DC
  Administered 2012-05-10: 40000 [IU] via SUBCUTANEOUS
  Filled 2012-05-10: qty 1

## 2012-06-07 ENCOUNTER — Encounter (HOSPITAL_COMMUNITY)
Admission: RE | Admit: 2012-06-07 | Discharge: 2012-06-07 | Disposition: A | Discharge status: Home / Self Care | Payer: Medicare Other | Source: Ambulatory Visit | Attending: Nephrology | Admitting: Nephrology

## 2012-06-07 DIAGNOSIS — D638 Anemia in other chronic diseases classified elsewhere: Secondary | ICD-10-CM | POA: Insufficient documentation

## 2012-06-07 DIAGNOSIS — N184 Chronic kidney disease, stage 4 (severe): Secondary | ICD-10-CM | POA: Insufficient documentation

## 2012-06-07 LAB — IRON AND TIBC
Iron: 86 ug/dL (ref 42–135)
UIBC: 233 ug/dL (ref 125–400)

## 2012-06-07 MED ORDER — EPOETIN ALFA 40000 UNIT/ML IJ SOLN: 40000.0000 [IU] | INTRAMUSCULAR | Status: DC

## 2012-06-22 ENCOUNTER — Encounter (HOSPITAL_COMMUNITY): Payer: Medicare Other

## 2012-06-23 ENCOUNTER — Other Ambulatory Visit: Payer: Self-pay | Admitting: Cardiology

## 2012-06-23 DIAGNOSIS — I6529 Occlusion and stenosis of unspecified carotid artery: Secondary | ICD-10-CM

## 2012-06-27 ENCOUNTER — Encounter (INDEPENDENT_AMBULATORY_CARE_PROVIDER_SITE_OTHER): Payer: Medicare Other

## 2012-06-27 DIAGNOSIS — I6529 Occlusion and stenosis of unspecified carotid artery: Secondary | ICD-10-CM

## 2012-06-27 DIAGNOSIS — R0989 Other specified symptoms and signs involving the circulatory and respiratory systems: Secondary | ICD-10-CM

## 2012-07-04 ENCOUNTER — Other Ambulatory Visit (HOSPITAL_COMMUNITY): Payer: Self-pay | Admitting: *Deleted

## 2012-07-05 ENCOUNTER — Encounter (HOSPITAL_COMMUNITY)
Admission: RE | Admit: 2012-07-05 | Discharge: 2012-07-05 | Disposition: A | Discharge status: Home / Self Care | Payer: Medicare Other | Source: Ambulatory Visit | Attending: Nephrology | Admitting: Nephrology

## 2012-07-05 DIAGNOSIS — D638 Anemia in other chronic diseases classified elsewhere: Secondary | ICD-10-CM | POA: Insufficient documentation

## 2012-07-05 DIAGNOSIS — N184 Chronic kidney disease, stage 4 (severe): Secondary | ICD-10-CM | POA: Insufficient documentation

## 2012-07-05 LAB — IRON AND TIBC
Iron: 70 ug/dL (ref 42–135)
Saturation Ratios: 22 % (ref 20–55)
UIBC: 251 ug/dL (ref 125–400)

## 2012-07-05 LAB — FERRITIN: Ferritin: 54 ng/mL (ref 22–322)

## 2012-07-05 MED ORDER — EPOETIN ALFA 40000 UNIT/ML IJ SOLN: 40000.0000 [IU] | INTRAMUSCULAR | Status: DC

## 2012-07-19 ENCOUNTER — Encounter (HOSPITAL_COMMUNITY): Payer: Medicare Other

## 2012-08-01 ENCOUNTER — Other Ambulatory Visit (HOSPITAL_COMMUNITY): Payer: Self-pay

## 2012-08-02 ENCOUNTER — Encounter (HOSPITAL_COMMUNITY)
Admission: RE | Admit: 2012-08-02 | Discharge: 2012-08-02 | Disposition: A | Discharge status: Home / Self Care | Payer: Medicare Other | Source: Ambulatory Visit | Attending: Nephrology | Admitting: Nephrology

## 2012-08-02 DIAGNOSIS — N184 Chronic kidney disease, stage 4 (severe): Secondary | ICD-10-CM | POA: Insufficient documentation

## 2012-08-02 DIAGNOSIS — D638 Anemia in other chronic diseases classified elsewhere: Secondary | ICD-10-CM | POA: Insufficient documentation

## 2012-08-02 LAB — COMPREHENSIVE METABOLIC PANEL
ALT: 24 U/L (ref 0–53)
AST: 33 U/L (ref 0–37)
Alkaline Phosphatase: 132 U/L — ABNORMAL HIGH (ref 39–117)
CO2: 28 mEq/L (ref 19–32)
Calcium: 9.7 mg/dL (ref 8.4–10.5)
Chloride: 100 mEq/L (ref 96–112)
GFR calc Af Amer: 32 mL/min — ABNORMAL LOW (ref >90)
GFR calc non Af Amer: 27 mL/min — ABNORMAL LOW (ref >90)
Glucose, Bld: 109 mg/dL — ABNORMAL HIGH (ref 70–99)
Sodium: 136 mEq/L (ref 135–145)
Total Bilirubin: 0.6 mg/dL (ref 0.3–1.2)

## 2012-08-02 LAB — IRON AND TIBC: TIBC: 312 ug/dL (ref 215–435)

## 2012-08-02 MED ORDER — EPOETIN ALFA 40000 UNIT/ML IJ SOLN: 40000.0000 [IU] | INTRAMUSCULAR | Status: DC

## 2012-08-30 ENCOUNTER — Encounter (HOSPITAL_COMMUNITY)
Admission: RE | Admit: 2012-08-30 | Discharge: 2012-08-30 | Disposition: A | Discharge status: Home / Self Care | Payer: Medicare Other | Source: Ambulatory Visit | Attending: Nephrology | Admitting: Nephrology

## 2012-08-30 DIAGNOSIS — D638 Anemia in other chronic diseases classified elsewhere: Secondary | ICD-10-CM | POA: Insufficient documentation

## 2012-08-30 DIAGNOSIS — N184 Chronic kidney disease, stage 4 (severe): Secondary | ICD-10-CM | POA: Insufficient documentation

## 2012-08-30 LAB — IRON AND TIBC
Iron: 87 ug/dL (ref 42–135)
UIBC: 257 ug/dL (ref 125–400)

## 2012-08-30 LAB — POCT HEMOGLOBIN-HEMACUE: Hemoglobin: 11.8 g/dL — ABNORMAL LOW (ref 13.0–17.0)

## 2012-08-30 LAB — FERRITIN: Ferritin: 37 ng/mL (ref 22–322)

## 2012-08-30 MED ORDER — EPOETIN ALFA 40000 UNIT/ML IJ SOLN
40000.0000 [IU] | INTRAMUSCULAR | Status: DC
  Administered 2012-08-30: 40000 [IU] via SUBCUTANEOUS

## 2012-08-30 MED ORDER — EPOETIN ALFA 40000 UNIT/ML IJ SOLN
INTRAMUSCULAR | Status: AC
  Administered 2012-08-30: 40000 [IU] via SUBCUTANEOUS
  Filled 2012-08-30: qty 1

## 2012-09-27 ENCOUNTER — Encounter (HOSPITAL_COMMUNITY): Payer: Medicare Other

## 2012-10-31 ENCOUNTER — Other Ambulatory Visit (HOSPITAL_COMMUNITY): Payer: Self-pay | Admitting: *Deleted

## 2012-11-01 ENCOUNTER — Ambulatory Visit (HOSPITAL_COMMUNITY)
Admission: RE | Admit: 2012-11-01 | Discharge: 2012-11-01 | Disposition: A | Discharge status: Home / Self Care | Payer: Medicare Other | Source: Ambulatory Visit | Attending: Nephrology | Admitting: Nephrology

## 2012-11-01 DIAGNOSIS — D638 Anemia in other chronic diseases classified elsewhere: Secondary | ICD-10-CM | POA: Insufficient documentation

## 2012-11-01 DIAGNOSIS — N184 Chronic kidney disease, stage 4 (severe): Secondary | ICD-10-CM | POA: Insufficient documentation

## 2012-11-01 LAB — FERRITIN: Ferritin: 23 ng/mL (ref 22–322)

## 2012-11-01 LAB — IRON AND TIBC: UIBC: 252 ug/dL (ref 125–400)

## 2012-11-01 MED ORDER — EPOETIN ALFA 20000 UNIT/ML IJ SOLN: 20000.0000 [IU] | INTRAMUSCULAR | Status: DC

## 2012-11-02 LAB — POCT HEMOGLOBIN-HEMACUE: Hemoglobin: 12.1 g/dL — ABNORMAL LOW (ref 13.0–17.0)

## 2012-11-15 ENCOUNTER — Encounter (HOSPITAL_COMMUNITY): Payer: Medicare Other

## 2012-11-28 ENCOUNTER — Other Ambulatory Visit (HOSPITAL_COMMUNITY): Payer: Self-pay | Admitting: *Deleted

## 2012-11-29 ENCOUNTER — Encounter (HOSPITAL_COMMUNITY)
Admission: RE | Admit: 2012-11-29 | Discharge: 2012-11-29 | Disposition: A | Discharge status: Home / Self Care | Payer: Medicare Other | Source: Ambulatory Visit | Attending: Nephrology | Admitting: Nephrology

## 2012-11-29 DIAGNOSIS — D638 Anemia in other chronic diseases classified elsewhere: Secondary | ICD-10-CM | POA: Insufficient documentation

## 2012-11-29 DIAGNOSIS — N184 Chronic kidney disease, stage 4 (severe): Secondary | ICD-10-CM | POA: Insufficient documentation

## 2012-11-29 LAB — IRON AND TIBC
Saturation Ratios: 30 % (ref 20–55)
TIBC: 349 ug/dL (ref 215–435)

## 2012-11-29 MED ORDER — EPOETIN ALFA 20000 UNIT/ML IJ SOLN: 20000.0000 [IU] | INTRAMUSCULAR | Status: DC

## 2012-12-21 ENCOUNTER — Encounter: Payer: Self-pay | Admitting: Cardiology

## 2012-12-27 ENCOUNTER — Encounter (HOSPITAL_COMMUNITY): Payer: 59

## 2013-01-02 ENCOUNTER — Other Ambulatory Visit (HOSPITAL_COMMUNITY): Payer: Self-pay | Admitting: *Deleted

## 2013-01-03 ENCOUNTER — Encounter (HOSPITAL_COMMUNITY)
Admission: RE | Admit: 2013-01-03 | Discharge: 2013-01-03 | Disposition: A | Discharge status: Home / Self Care | Payer: Medicare Other | Source: Ambulatory Visit | Attending: Nephrology | Admitting: Nephrology

## 2013-01-03 DIAGNOSIS — D638 Anemia in other chronic diseases classified elsewhere: Secondary | ICD-10-CM | POA: Insufficient documentation

## 2013-01-03 DIAGNOSIS — N184 Chronic kidney disease, stage 4 (severe): Secondary | ICD-10-CM | POA: Insufficient documentation

## 2013-01-03 LAB — IRON AND TIBC
Iron: 80 ug/dL (ref 42–135)
Saturation Ratios: 23 % (ref 20–55)
UIBC: 274 ug/dL (ref 125–400)

## 2013-01-03 LAB — FERRITIN: Ferritin: 25 ng/mL (ref 22–322)

## 2013-01-03 MED ORDER — EPOETIN ALFA 20000 UNIT/ML IJ SOLN
INTRAMUSCULAR | Status: AC
  Filled 2013-01-03: qty 1

## 2013-01-03 MED ORDER — EPOETIN ALFA 20000 UNIT/ML IJ SOLN
20000.0000 [IU] | INTRAMUSCULAR | Status: DC
  Administered 2013-01-03: 20000 [IU] via SUBCUTANEOUS

## 2013-01-05 ENCOUNTER — Other Ambulatory Visit: Payer: Self-pay | Admitting: *Deleted

## 2013-01-05 MED ORDER — METOPROLOL SUCCINATE ER 25 MG PO TB24: 25.0000 mg | ORAL_TABLET | Freq: Two times a day (BID) | ORAL | Status: DC

## 2013-01-09 ENCOUNTER — Other Ambulatory Visit (INDEPENDENT_AMBULATORY_CARE_PROVIDER_SITE_OTHER): Payer: Medicare Other

## 2013-01-09 DIAGNOSIS — Z1212 Encounter for screening for malignant neoplasm of rectum: Secondary | ICD-10-CM

## 2013-01-09 NOTE — Progress Notes (Signed)
Patient dropped off fobt 

## 2013-01-10 LAB — FECAL OCCULT BLOOD, IMMUNOCHEMICAL: Fecal Occult Blood: NEGATIVE

## 2013-01-31 ENCOUNTER — Encounter (HOSPITAL_COMMUNITY)
Admission: RE | Admit: 2013-01-31 | Discharge: 2013-01-31 | Disposition: A | Discharge status: Home / Self Care | Payer: Medicare Other | Source: Ambulatory Visit | Attending: Nephrology | Admitting: Nephrology

## 2013-01-31 DIAGNOSIS — N184 Chronic kidney disease, stage 4 (severe): Secondary | ICD-10-CM | POA: Insufficient documentation

## 2013-01-31 DIAGNOSIS — D638 Anemia in other chronic diseases classified elsewhere: Secondary | ICD-10-CM | POA: Insufficient documentation

## 2013-01-31 LAB — IRON AND TIBC: UIBC: 297 ug/dL (ref 125–400)

## 2013-01-31 MED ORDER — EPOETIN ALFA 20000 UNIT/ML IJ SOLN
INTRAMUSCULAR | Status: AC
  Administered 2013-01-31: 20000 [IU] via SUBCUTANEOUS
  Filled 2013-01-31: qty 1

## 2013-01-31 MED ORDER — EPOETIN ALFA 20000 UNIT/ML IJ SOLN: 20000.0000 [IU] | INTRAMUSCULAR | Status: DC

## 2013-02-21 ENCOUNTER — Other Ambulatory Visit: Payer: Self-pay

## 2013-02-21 MED ORDER — METOPROLOL SUCCINATE ER 25 MG PO TB24: 25.0000 mg | ORAL_TABLET | Freq: Two times a day (BID) | ORAL | Status: DC

## 2013-02-28 ENCOUNTER — Encounter (HOSPITAL_COMMUNITY)
Admission: RE | Admit: 2013-02-28 | Discharge: 2013-02-28 | Disposition: A | Discharge status: Home / Self Care | Payer: Medicare Other | Source: Ambulatory Visit | Attending: Nephrology | Admitting: Nephrology

## 2013-02-28 DIAGNOSIS — D638 Anemia in other chronic diseases classified elsewhere: Secondary | ICD-10-CM | POA: Insufficient documentation

## 2013-02-28 DIAGNOSIS — N184 Chronic kidney disease, stage 4 (severe): Secondary | ICD-10-CM | POA: Insufficient documentation

## 2013-02-28 LAB — IRON AND TIBC: Iron: 58 ug/dL (ref 42–135)

## 2013-02-28 LAB — FERRITIN: Ferritin: 20 ng/mL — ABNORMAL LOW (ref 22–322)

## 2013-02-28 MED ORDER — EPOETIN ALFA 20000 UNIT/ML IJ SOLN
INTRAMUSCULAR | Status: AC
  Filled 2013-02-28: qty 1

## 2013-02-28 MED ORDER — EPOETIN ALFA 20000 UNIT/ML IJ SOLN
20000.0000 [IU] | INTRAMUSCULAR | Status: DC
  Administered 2013-02-28: 20000 [IU] via SUBCUTANEOUS

## 2013-03-24 ENCOUNTER — Encounter (HOSPITAL_COMMUNITY): Payer: 59

## 2013-03-27 ENCOUNTER — Other Ambulatory Visit (HOSPITAL_COMMUNITY): Payer: Self-pay | Admitting: *Deleted

## 2013-03-28 ENCOUNTER — Encounter (HOSPITAL_COMMUNITY)
Admission: RE | Admit: 2013-03-28 | Discharge: 2013-03-28 | Disposition: A | Discharge status: Home / Self Care | Payer: 59 | Source: Ambulatory Visit | Attending: Nephrology | Admitting: Nephrology

## 2013-03-28 DIAGNOSIS — N184 Chronic kidney disease, stage 4 (severe): Secondary | ICD-10-CM | POA: Insufficient documentation

## 2013-03-28 DIAGNOSIS — D638 Anemia in other chronic diseases classified elsewhere: Secondary | ICD-10-CM | POA: Insufficient documentation

## 2013-03-28 LAB — IRON AND TIBC
Saturation Ratios: 17 % — ABNORMAL LOW (ref 20–55)
TIBC: 337 ug/dL (ref 215–435)
UIBC: 280 ug/dL (ref 125–400)

## 2013-03-28 MED ORDER — FERUMOXYTOL INJECTION 510 MG/17 ML: 510.0000 mg | Freq: Once | INTRAVENOUS | Status: AC

## 2013-03-28 MED ORDER — SODIUM CHLORIDE 0.9 % IV SOLN
INTRAVENOUS | Status: DC
  Administered 2013-03-28: 10:00:00 via INTRAVENOUS

## 2013-03-28 MED ORDER — EPOETIN ALFA 20000 UNIT/ML IJ SOLN: 20000.0000 [IU] | INTRAMUSCULAR | Status: DC

## 2013-03-28 MED ORDER — FERUMOXYTOL INJECTION 510 MG/17 ML
INTRAVENOUS | Status: AC
  Administered 2013-03-28: 510 mg via INTRAVENOUS
  Filled 2013-03-28: qty 17

## 2013-03-28 MED ORDER — EPOETIN ALFA 20000 UNIT/ML IJ SOLN
INTRAMUSCULAR | Status: AC
  Administered 2013-03-28: 20000 [IU] via SUBCUTANEOUS
  Filled 2013-03-28: qty 1

## 2013-04-10 ENCOUNTER — Encounter (HOSPITAL_COMMUNITY): Payer: 59

## 2013-04-25 ENCOUNTER — Encounter (HOSPITAL_COMMUNITY)
Admission: RE | Admit: 2013-04-25 | Discharge: 2013-04-25 | Disposition: A | Discharge status: Home / Self Care | Payer: Medicare Other | Source: Ambulatory Visit | Attending: Nephrology | Admitting: Nephrology

## 2013-04-25 DIAGNOSIS — D638 Anemia in other chronic diseases classified elsewhere: Secondary | ICD-10-CM | POA: Insufficient documentation

## 2013-04-25 DIAGNOSIS — N184 Chronic kidney disease, stage 4 (severe): Secondary | ICD-10-CM | POA: Insufficient documentation

## 2013-04-25 LAB — IRON AND TIBC
Iron: 91 ug/dL (ref 42–135)
Saturation Ratios: 35 % (ref 20–55)
UIBC: 172 ug/dL (ref 125–400)

## 2013-04-25 MED ORDER — EPOETIN ALFA 20000 UNIT/ML IJ SOLN
INTRAMUSCULAR | Status: AC
  Filled 2013-04-25: qty 1

## 2013-04-25 MED ORDER — EPOETIN ALFA 20000 UNIT/ML IJ SOLN
20000.0000 [IU] | INTRAMUSCULAR | Status: DC
  Administered 2013-04-25: 20000 [IU] via SUBCUTANEOUS

## 2013-05-01 ENCOUNTER — Ambulatory Visit (INDEPENDENT_AMBULATORY_CARE_PROVIDER_SITE_OTHER): Payer: Medicare Other | Admitting: Family Medicine

## 2013-05-01 ENCOUNTER — Encounter: Payer: Self-pay | Admitting: Family Medicine

## 2013-05-01 VITALS — BP 147/72 | HR 63 | Temp 97.2°F | Ht 66.5 in | Wt 175.8 lb

## 2013-05-01 DIAGNOSIS — E559 Vitamin D deficiency, unspecified: Secondary | ICD-10-CM

## 2013-05-01 DIAGNOSIS — N189 Chronic kidney disease, unspecified: Secondary | ICD-10-CM

## 2013-05-01 DIAGNOSIS — E785 Hyperlipidemia, unspecified: Secondary | ICD-10-CM

## 2013-05-01 DIAGNOSIS — I10 Essential (primary) hypertension: Secondary | ICD-10-CM

## 2013-05-01 DIAGNOSIS — D649 Anemia, unspecified: Secondary | ICD-10-CM

## 2013-05-01 DIAGNOSIS — I2510 Atherosclerotic heart disease of native coronary artery without angina pectoris: Secondary | ICD-10-CM

## 2013-05-01 LAB — POCT CBC
Granulocyte percent: 80.8 %G — AB (ref 37–80)
HCT, POC: 37.8 % — AB (ref 43.5–53.7)
MCV: 101.2 fL — AB (ref 80–97)
Platelet Count, POC: 175 10*3/uL (ref 142–424)
RBC: 3.7 M/uL — AB (ref 4.69–6.13)

## 2013-05-01 NOTE — Patient Instructions (Addendum)
Continue medications as directed This summer drink plenty of fluids Be careful not to fall and not to move too quickly Followup visit with the cardiologist

## 2013-05-01 NOTE — Progress Notes (Signed)
  Subjective:    Patient ID: Aaron Hahn, male    DOB: 1925/07/28, 79 y.o.   MRN: [id number]  HPI The patient returns to clinic today for followup of chronic medical problems and management. He sees the cardiologist and the nephrologist periodically. He sees a nephrologist about every 4 months and the cardiologist about every 18 months to 2 years. According to my records he may be due to a cardiology visit. Also see the review of systems. He denies any chest pain. Labs will be drawn today to monitor her the above problems.  Review of Systems  Constitutional: Positive for appetite change (decreased) and fatigue (stable).  HENT: Positive for postnasal drip (due to allergies).   Eyes: Negative.   Respiratory: Positive for shortness of breath (stable) and wheezing (slight). Negative for cough and chest tightness.   Cardiovascular: Negative.   Gastrointestinal: Negative.   Genitourinary: Negative.   Musculoskeletal: Positive for back pain.  Allergic/Immunologic: Positive for environmental allergies.  Neurological: Negative.   Psychiatric/Behavioral: Negative.        Objective:   Physical Exam BP 147/72  Pulse 63  Temp(Src) 97.2 F (36.2 C) (Oral)  Ht 5' 6.5" (1.689 m)  Wt 175 lb 12.8 oz (79.742 kg)  BMI 27.95 kg/m2 Last weight in March was 180 pounds. The patient appeared well nourished and normally developed especially for his age. He is alert and oriented to time and place. Speech, behavior and judgement appear normal. Vital signs as documented.  Head exam is unremarkable. No scleral icterus or pallor noted. Ears nose and throat were normal.  Neck is without jugular venous distension, thyromegally, or carotid bruits. Carotid upstrokes are brisk bilaterally. He does have bilateral supraclavicular bruits but good radial pulses .No cervical adenopathy. Lungs are clear anteriorly and posteriorly to auscultation. Normal respiratory effort. Cardiac exam reveals regular rate and rhythm  at 72 per minute. First and second heart sounds normal.  No murmurs, rubs or gallops.  Abdominal exam reveals obesity, normal bowl sounds, no masses, no organomegaly and no aortic enlargement. No inguinal adenopathy. Extremities are nonedematous and both femoral and dorsalis pedis pedal pulses are normal. Skin without pallor or jaundice.  Warm and dry, without rash. Neurologic exam reveals normal deep tendon reflexes and normal sensation.          Assessment & Plan:  1. Vitamin D deficiency - Vitamin D 25 hydroxy; Standing - Vitamin D 25 hydroxy  2. Hypertension - BASIC METABOLIC PANEL WITH GFR; Standing - BASIC METABOLIC PANEL WITH GFR  3. CKD (chronic kidney disease), unspecified stage  4. Hyperlipemia - Hepatic function panel; Standing - NMR Lipoprofile with Lipids; Standing - Hepatic function panel - NMR Lipoprofile with Lipids  5. Anemia - POCT CBC; Standing - POCT CBC  6. CAD  Patient Instructions  Continue medications as directed This summer drink plenty of fluids Be careful not to fall and not to move too quickly Followup visit with the cardiologist   Nyra Capes MD

## 2013-05-02 LAB — HEPATIC FUNCTION PANEL
ALT: 19 U/L (ref 0–53)
Bilirubin, Direct: 0.2 mg/dL (ref 0.0–0.3)
Indirect Bilirubin: 0.6 mg/dL (ref 0.0–0.9)
Total Bilirubin: 0.8 mg/dL (ref 0.3–1.2)

## 2013-05-02 LAB — BASIC METABOLIC PANEL WITH GFR
BUN: 20 mg/dL (ref 6–23)
Chloride: 103 mEq/L (ref 96–112)
GFR, Est Non African American: 33 mL/min — ABNORMAL LOW
Glucose, Bld: 96 mg/dL (ref 70–99)
Potassium: 4.9 mEq/L (ref 3.5–5.3)
Sodium: 139 mEq/L (ref 135–145)

## 2013-05-02 LAB — NMR LIPOPROFILE WITH LIPIDS
Cholesterol, Total: 128 mg/dL (ref <200)
HDL Particle Number: 24.8 umol/L — ABNORMAL LOW (ref >=30.5)
HDL-C: 30 mg/dL — ABNORMAL LOW (ref >=40)
LDL (calc): 74 mg/dL (ref <100)
LP-IR Score: 63 — ABNORMAL HIGH (ref <=45)
Small LDL Particle Number: 880 nmol/L — ABNORMAL HIGH (ref <=527)
Triglycerides: 120 mg/dL (ref <150)
VLDL Size: 47.2 nm — ABNORMAL HIGH (ref <=46.6)

## 2013-05-03 ENCOUNTER — Telehealth: Payer: Self-pay | Admitting: *Deleted

## 2013-05-03 NOTE — Telephone Encounter (Signed)
Message copied by Bearl Mulberry on Wed May 03, 2013  6:10 PM ------      Message from: Ernestina Penna      Created: Mon May 01, 2013  1:45 PM       The CBC had a normal white blood cell count. The hemoglobin was slightly decreased at 12.8 but higher than it has been in the past. The platelet count was adequate. ------

## 2013-05-03 NOTE — Telephone Encounter (Signed)
Pts husband notified of results

## 2013-05-22 ENCOUNTER — Other Ambulatory Visit (HOSPITAL_COMMUNITY): Payer: Self-pay | Admitting: *Deleted

## 2013-05-23 ENCOUNTER — Other Ambulatory Visit: Payer: Self-pay | Admitting: *Deleted

## 2013-05-23 ENCOUNTER — Encounter (HOSPITAL_COMMUNITY)
Admission: RE | Admit: 2013-05-23 | Discharge: 2013-05-23 | Disposition: A | Discharge status: Home / Self Care | Payer: Medicare Other | Source: Ambulatory Visit | Attending: Nephrology | Admitting: Nephrology

## 2013-05-23 DIAGNOSIS — D638 Anemia in other chronic diseases classified elsewhere: Secondary | ICD-10-CM | POA: Insufficient documentation

## 2013-05-23 DIAGNOSIS — N184 Chronic kidney disease, stage 4 (severe): Secondary | ICD-10-CM | POA: Insufficient documentation

## 2013-05-23 LAB — IRON AND TIBC
Iron: 108 ug/dL (ref 42–135)
Saturation Ratios: 34 % (ref 20–55)
TIBC: 317 ug/dL (ref 215–435)
UIBC: 209 ug/dL (ref 125–400)

## 2013-05-23 MED ORDER — METOPROLOL SUCCINATE ER 25 MG PO TB24: 25.0000 mg | ORAL_TABLET | Freq: Two times a day (BID) | ORAL | Status: DC

## 2013-05-23 MED ORDER — EPOETIN ALFA 20000 UNIT/ML IJ SOLN: 15000.0000 [IU] | INTRAMUSCULAR | Status: DC

## 2013-06-01 ENCOUNTER — Other Ambulatory Visit: Payer: Self-pay

## 2013-06-01 MED ORDER — ALLOPURINOL 100 MG PO TABS: 100.0000 mg | ORAL_TABLET | Freq: Two times a day (BID) | ORAL | Status: AC

## 2013-06-20 ENCOUNTER — Encounter (HOSPITAL_COMMUNITY)
Admission: RE | Admit: 2013-06-20 | Discharge: 2013-06-20 | Disposition: A | Discharge status: Home / Self Care | Payer: Medicare Other | Source: Ambulatory Visit | Attending: Nephrology | Admitting: Nephrology

## 2013-06-20 ENCOUNTER — Other Ambulatory Visit: Payer: Self-pay

## 2013-06-20 DIAGNOSIS — N184 Chronic kidney disease, stage 4 (severe): Secondary | ICD-10-CM | POA: Insufficient documentation

## 2013-06-20 DIAGNOSIS — D638 Anemia in other chronic diseases classified elsewhere: Secondary | ICD-10-CM | POA: Insufficient documentation

## 2013-06-20 LAB — FERRITIN: Ferritin: 48 ng/mL (ref 22–322)

## 2013-06-20 LAB — IRON AND TIBC: UIBC: 231 ug/dL (ref 125–400)

## 2013-06-20 MED ORDER — ATORVASTATIN CALCIUM 80 MG PO TABS: 80.0000 mg | ORAL_TABLET | Freq: Every day | ORAL | Status: AC

## 2013-06-20 MED ORDER — EPOETIN ALFA 20000 UNIT/ML IJ SOLN: 15000.0000 [IU] | INTRAMUSCULAR | Status: DC

## 2013-06-27 ENCOUNTER — Encounter (INDEPENDENT_AMBULATORY_CARE_PROVIDER_SITE_OTHER): Payer: Medicare Other

## 2013-06-27 DIAGNOSIS — I6529 Occlusion and stenosis of unspecified carotid artery: Secondary | ICD-10-CM

## 2013-07-07 ENCOUNTER — Other Ambulatory Visit: Payer: Self-pay | Admitting: *Deleted

## 2013-07-07 MED ORDER — CALCITRIOL 0.5 MCG PO CAPS: 0.5000 ug | ORAL_CAPSULE | Freq: Every day | ORAL | Status: AC

## 2013-07-18 ENCOUNTER — Encounter (HOSPITAL_COMMUNITY)
Admission: RE | Admit: 2013-07-18 | Discharge: 2013-07-18 | Disposition: A | Discharge status: Home / Self Care | Payer: Medicare Other | Source: Ambulatory Visit | Attending: Nephrology | Admitting: Nephrology

## 2013-07-18 LAB — IRON AND TIBC: Iron: 100 ug/dL (ref 42–135)

## 2013-07-18 LAB — POCT HEMOGLOBIN-HEMACUE: Hemoglobin: 12.5 g/dL — ABNORMAL LOW (ref 13.0–17.0)

## 2013-07-18 LAB — FERRITIN: Ferritin: 44 ng/mL (ref 22–322)

## 2013-07-18 MED ORDER — EPOETIN ALFA 20000 UNIT/ML IJ SOLN: 15000.0000 [IU] | INTRAMUSCULAR | Status: DC

## 2013-08-14 ENCOUNTER — Other Ambulatory Visit (HOSPITAL_COMMUNITY): Payer: Self-pay | Admitting: *Deleted

## 2013-08-15 ENCOUNTER — Encounter (HOSPITAL_COMMUNITY)
Admission: RE | Admit: 2013-08-15 | Discharge: 2013-08-15 | Disposition: A | Discharge status: Home / Self Care | Payer: Medicare Other | Source: Ambulatory Visit | Attending: Nephrology | Admitting: Nephrology

## 2013-08-15 DIAGNOSIS — N184 Chronic kidney disease, stage 4 (severe): Secondary | ICD-10-CM | POA: Insufficient documentation

## 2013-08-15 DIAGNOSIS — D638 Anemia in other chronic diseases classified elsewhere: Secondary | ICD-10-CM | POA: Insufficient documentation

## 2013-08-15 LAB — IRON AND TIBC
Saturation Ratios: 32 % (ref 20–55)
TIBC: 310 ug/dL (ref 215–435)

## 2013-08-15 MED ORDER — EPOETIN ALFA 20000 UNIT/ML IJ SOLN
INTRAMUSCULAR | Status: AC
  Filled 2013-08-15: qty 1

## 2013-08-15 MED ORDER — EPOETIN ALFA 20000 UNIT/ML IJ SOLN
15000.0000 [IU] | INTRAMUSCULAR | Status: DC
  Administered 2013-08-15: 15000 [IU] via SUBCUTANEOUS

## 2013-09-12 ENCOUNTER — Encounter (HOSPITAL_COMMUNITY)
Admission: RE | Admit: 2013-09-12 | Discharge: 2013-09-12 | Disposition: A | Discharge status: Home / Self Care | Payer: Medicare Other | Source: Ambulatory Visit | Attending: Nephrology | Admitting: Nephrology

## 2013-09-12 DIAGNOSIS — D638 Anemia in other chronic diseases classified elsewhere: Secondary | ICD-10-CM | POA: Insufficient documentation

## 2013-09-12 DIAGNOSIS — N184 Chronic kidney disease, stage 4 (severe): Secondary | ICD-10-CM | POA: Insufficient documentation

## 2013-09-12 LAB — IRON AND TIBC: UIBC: 227 ug/dL (ref 125–400)

## 2013-09-12 LAB — FERRITIN: Ferritin: 28 ng/mL (ref 22–322)

## 2013-09-12 LAB — POCT HEMOGLOBIN-HEMACUE: Hemoglobin: 12.1 g/dL — ABNORMAL LOW (ref 13.0–17.0)

## 2013-09-12 MED ORDER — EPOETIN ALFA 20000 UNIT/ML IJ SOLN: 15000.0000 [IU] | INTRAMUSCULAR | Status: DC

## 2013-10-03 ENCOUNTER — Other Ambulatory Visit (INDEPENDENT_AMBULATORY_CARE_PROVIDER_SITE_OTHER): Payer: Medicare Other

## 2013-10-03 DIAGNOSIS — I10 Essential (primary) hypertension: Secondary | ICD-10-CM

## 2013-10-03 DIAGNOSIS — D649 Anemia, unspecified: Secondary | ICD-10-CM

## 2013-10-03 DIAGNOSIS — E785 Hyperlipidemia, unspecified: Secondary | ICD-10-CM

## 2013-10-03 DIAGNOSIS — E559 Vitamin D deficiency, unspecified: Secondary | ICD-10-CM

## 2013-10-03 LAB — POCT CBC
HCT, POC: 39.6 % — AB (ref 43.5–53.7)
Hemoglobin: 12.9 g/dL — AB (ref 14.1–18.1)
Lymph, poc: 1.2 (ref 0.6–3.4)
MCHC: 32.7 g/dL (ref 31.8–35.4)
POC Granulocyte: 6.9 (ref 2–6.9)
POC LYMPH PERCENT: 14.2 %L (ref 10–50)
Platelet Count, POC: 181 10*3/uL (ref 142–424)
RBC: 3.8 M/uL — AB (ref 4.69–6.13)

## 2013-10-03 LAB — HEPATIC FUNCTION PANEL
AST: 28 U/L (ref 0–37)
Alkaline Phosphatase: 131 U/L — ABNORMAL HIGH (ref 39–117)
Bilirubin, Direct: 0.1 mg/dL (ref 0.0–0.3)
Indirect Bilirubin: 0.5 mg/dL (ref 0.0–0.9)
Total Bilirubin: 0.6 mg/dL (ref 0.3–1.2)
Total Protein: 7 g/dL (ref 6.0–8.3)

## 2013-10-03 LAB — BASIC METABOLIC PANEL WITH GFR
CO2: 27 mEq/L (ref 19–32)
Calcium: 9.3 mg/dL (ref 8.4–10.5)
Chloride: 105 mEq/L (ref 96–112)
Creat: 1.93 mg/dL — ABNORMAL HIGH (ref 0.50–1.35)
Glucose, Bld: 105 mg/dL — ABNORMAL HIGH (ref 70–99)
Potassium: 4.4 mEq/L (ref 3.5–5.3)
Sodium: 141 mEq/L (ref 135–145)

## 2013-10-03 NOTE — Progress Notes (Signed)
Patient came in for labs only.

## 2013-10-04 LAB — NMR LIPOPROFILE WITH LIPIDS
Cholesterol, Total: 136 mg/dL (ref <200)
HDL Particle Number: 23.2 umol/L — ABNORMAL LOW (ref >=30.5)
HDL-C: 34 mg/dL — ABNORMAL LOW (ref >=40)
LDL (calc): 74 mg/dL (ref <100)
LDL Particle Number: 1238 nmol/L — ABNORMAL HIGH (ref <1000)
Large HDL-P: <1.3 umol/L — ABNORMAL LOW (ref >=4.8)
Large VLDL-P: 2.5 nmol/L (ref <=2.7)
Triglycerides: 141 mg/dL (ref <150)
VLDL Size: 51 nm — ABNORMAL HIGH (ref <=46.6)

## 2013-10-09 ENCOUNTER — Ambulatory Visit (INDEPENDENT_AMBULATORY_CARE_PROVIDER_SITE_OTHER): Payer: Medicare Other | Admitting: Family Medicine

## 2013-10-09 ENCOUNTER — Encounter: Payer: Self-pay | Admitting: Family Medicine

## 2013-10-09 VITALS — BP 153/83 | HR 62 | Temp 96.9°F | Ht 66.5 in | Wt 175.0 lb

## 2013-10-09 DIAGNOSIS — E785 Hyperlipidemia, unspecified: Secondary | ICD-10-CM

## 2013-10-09 DIAGNOSIS — I10 Essential (primary) hypertension: Secondary | ICD-10-CM

## 2013-10-09 DIAGNOSIS — N259 Disorder resulting from impaired renal tubular function, unspecified: Secondary | ICD-10-CM

## 2013-10-09 DIAGNOSIS — Z8739 Personal history of other diseases of the musculoskeletal system and connective tissue: Secondary | ICD-10-CM | POA: Insufficient documentation

## 2013-10-09 DIAGNOSIS — Z862 Personal history of diseases of the blood and blood-forming organs and certain disorders involving the immune mechanism: Secondary | ICD-10-CM

## 2013-10-09 DIAGNOSIS — I129 Hypertensive chronic kidney disease with stage 1 through stage 4 chronic kidney disease, or unspecified chronic kidney disease: Secondary | ICD-10-CM

## 2013-10-09 DIAGNOSIS — Z87898 Personal history of other specified conditions: Secondary | ICD-10-CM

## 2013-10-09 DIAGNOSIS — I2510 Atherosclerotic heart disease of native coronary artery without angina pectoris: Secondary | ICD-10-CM

## 2013-10-09 DIAGNOSIS — R0602 Shortness of breath: Secondary | ICD-10-CM

## 2013-10-09 DIAGNOSIS — J8410 Pulmonary fibrosis, unspecified: Secondary | ICD-10-CM | POA: Insufficient documentation

## 2013-10-09 DIAGNOSIS — Z23 Encounter for immunization: Secondary | ICD-10-CM

## 2013-10-09 MED ORDER — FUROSEMIDE 40 MG PO TABS: 40.0000 mg | ORAL_TABLET | Freq: Two times a day (BID) | ORAL | Status: AC

## 2013-10-09 MED ORDER — OMEPRAZOLE 20 MG PO CPDR: 20.0000 mg | DELAYED_RELEASE_CAPSULE | Freq: Two times a day (BID) | ORAL | Status: DC

## 2013-10-09 NOTE — Patient Instructions (Addendum)
Continue current medications. Continue good therapeutic lifestyle changes which include good diet and exercise. Fall precautions discussed with patient. Schedule your flu vaccine if you haven't had it yet If you are over 77 years old - you may need Prevnar 13 or the adult Pneumonia vaccine. The Prevnar vaccine and he will receive today may make her arm is sore Please keep your regular appointment with a nephrologist We will arrange a followup visit with the cardiologist and a new visit with the pulmonologist because of the diagnosis of pulmonary fibrosis This winter, drink plenty of fluids and keep the house is cool as tolerated. Monitor blood pressures at home Watch sodium intake

## 2013-10-09 NOTE — Addendum Note (Signed)
Addended by: Bearl Mulberry on: 10/09/2013 12:05 PM   Modules accepted: Orders

## 2013-10-09 NOTE — Progress Notes (Signed)
Subjective:    Patient ID: Aaron Hahn, male    DOB: 1925-05-23, 79 y.o.   MRN: [id number]  HPI Pt here for follow up and management of chronic medical problems. Patient complains of shortness of breath and fatigue. The shortness of breath is worse with exertion. His PaO2 this morning was 97% . The patient recently had carotid Dopplers and he indicated these were stable and I will be repeated again in one year. He has never seen a pulmonologist sewed this appointment will be arranged on a nonurgent basis for sometime in the new year. Recent labs were reviewed with patient and he has a good understanding of these results. He was given a copy of these lab reports.      Patient Active Problem List   Diagnosis Date Noted  . CAROTID ARTERY DISEASE 05/06/2010  . DYSLIPIDEMIA 06/18/2009  . HYPERTENSION 06/18/2009  . NEPHROSCLEROSIS 06/18/2009  . CAD 06/18/2009  . PUD 06/18/2009  . GASTROINTESTINAL HEMORRHAGE 06/18/2009  . RENAL INSUFFICIENCY 06/18/2009  . PARESTHESIA 06/18/2009  . BENIGN PROSTATIC HYPERTROPHY, HX OF 06/18/2009   Outpatient Encounter Prescriptions as of 10/09/2013  Medication Sig  . allopurinol (ZYLOPRIM) 100 MG tablet Take 1 tablet (100 mg total) by mouth 2 (two) times daily. 1 TAB TWICE A DAY  . aspirin 81 MG tablet Take 81 mg by mouth daily.    Marland Kitchen atorvastatin (LIPITOR) 80 MG tablet Take 1 tablet (80 mg total) by mouth daily. Take 1 tablet on Monday, Wednesday and Friday and take 1/2 tablet on all other days  . calcitRIOL (ROCALTROL) 0.5 MCG capsule Take 1 capsule (0.5 mcg total) by mouth daily.  Marland Kitchen Epoetin Alfa (PROCRIT IJ) Inject as directed. 1 SHOT EVERY EVERY 2 - 3 WEEKS   . furosemide (LASIX) 40 MG tablet Take 40 mg by mouth 2 (two) times daily.   . metoprolol succinate (TOPROL-XL) 25 MG 24 hr tablet Take 1 tablet (25 mg total) by mouth 2 (two) times daily.  Marland Kitchen omeprazole (PRILOSEC) 20 MG capsule Take 1 capsule by mouth 2 (two) times daily.  . [DISCONTINUED]  PRILOSEC OTC 20 MG tablet 2 TABS DAILY    Review of Systems  Constitutional: Positive for fatigue ("give out too quick").  HENT: Negative.   Eyes: Negative.   Respiratory: Positive for shortness of breath.   Cardiovascular: Negative.   Gastrointestinal: Negative.   Endocrine: Negative.   Genitourinary: Negative.   Musculoskeletal: Negative.   Skin: Negative.   Allergic/Immunologic: Negative.   Neurological: Negative.   Hematological: Negative.   Psychiatric/Behavioral: Negative.        Objective:   Physical Exam  Nursing note and vitals reviewed. Constitutional: He is oriented to person, place, and time. He appears well-developed and well-nourished. No distress.  Patient is a pleasant cooperative and understanding of everything that was discussed during the visit  HENT:  Head: Normocephalic and atraumatic.  Right Ear: External ear normal.  Left Ear: External ear normal.  Nose: Nose normal.  Mouth/Throat: Oropharynx is clear and moist. No oropharyngeal exudate.  Eyes: Conjunctivae and EOM are normal. Pupils are equal, round, and reactive to light. Right eye exhibits no discharge. Left eye exhibits no discharge. No scleral icterus.  Neck: Normal range of motion. Neck supple. No tracheal deviation present. No thyromegaly present.  There were bilateral carotid bruits  Cardiovascular: Normal rate, regular rhythm and intact distal pulses.  Exam reveals no gallop and no friction rub.   Murmur heard. 60 per minute with a grade  1/6 systolic ejection murmur  Pulmonary/Chest: Effort normal and breath sounds normal. No respiratory distress. He has no wheezes. He has no rales. He exhibits no tenderness.  No axillary nodes, breath sounds were somewhat diminished bilaterally with a few crackles in the bases.  Abdominal: Soft. Bowel sounds are normal. He exhibits no mass. There is no tenderness. There is no rebound and no guarding.  No inguinal nodes  Musculoskeletal: Normal range of  motion. He exhibits no edema and no tenderness.  Patient is kyphotic and somewhat barrel chested.  Lymphadenopathy:    He has no cervical adenopathy.  Neurological: He is alert and oriented to person, place, and time. He has normal reflexes. No cranial nerve deficit.  Skin: Skin is warm and dry. No rash noted. No erythema. No pallor.  Psychiatric: He has a normal mood and affect. His behavior is normal. Judgment and thought content normal.   BP 153/83  Pulse 62  Temp(Src) 96.9 F (36.1 C) (Oral)  Ht 5' 6.5" (1.689 m)  Wt 175 lb (79.379 kg)  BMI 27.83 kg/m2        Assessment & Plan:  1. BENIGN PROSTATIC HYPERTROPHY, HX OF  2. DYSLIPIDEMIA  3. HYPERTENSION  4. CAD  5. SOB (shortness of breath)  6. History of gout  7. NEPHROSCLEROSIS  8. RENAL INSUFFICIENCY  9. Pulmonary fibrosis -We will arrange a visit with the pulmonologist  No orders of the defined types were placed in this encounter.   Meds ordered this encounter  Medications  . DISCONTD: omeprazole (PRILOSEC) 20 MG capsule    Sig: Take 1 capsule by mouth 2 (two) times daily.  Marland Kitchen omeprazole (PRILOSEC) 20 MG capsule    Sig: Take 1 capsule (20 mg total) by mouth 2 (two) times daily.    Dispense:  60 capsule    Refill:  6  . furosemide (LASIX) 40 MG tablet    Sig: Take 1 tablet (40 mg total) by mouth 2 (two) times daily.    Dispense:  60 tablet    Refill:  6   Patient Instructions  Continue current medications. Continue good therapeutic lifestyle changes which include good diet and exercise. Fall precautions discussed with patient. Schedule your flu vaccine if you haven't had it yet If you are over 23 years old - you may need Prevnar 13 or the adult Pneumonia vaccine. The Prevnar vaccine and he will receive today may make her arm is sore Please keep your regular appointment with a nephrologist We will arrange a followup visit with the cardiologist and a new visit with the pulmonologist because of the  diagnosis of pulmonary fibrosis This winter, drink plenty of fluids and keep the house is cool as tolerated. Monitor blood pressures at home Watch sodium intake     Nyra Capes MD

## 2013-10-09 NOTE — Addendum Note (Signed)
Addended by: Magdalene River on: 10/09/2013 12:40 PM   Modules accepted: Orders

## 2013-10-10 ENCOUNTER — Encounter (HOSPITAL_COMMUNITY)
Admission: RE | Admit: 2013-10-10 | Discharge: 2013-10-10 | Disposition: A | Discharge status: Home / Self Care | Payer: Medicare Other | Source: Ambulatory Visit | Attending: Nephrology | Admitting: Nephrology

## 2013-10-10 DIAGNOSIS — D638 Anemia in other chronic diseases classified elsewhere: Secondary | ICD-10-CM | POA: Insufficient documentation

## 2013-10-10 DIAGNOSIS — N184 Chronic kidney disease, stage 4 (severe): Secondary | ICD-10-CM | POA: Insufficient documentation

## 2013-10-10 LAB — IRON AND TIBC
Iron: 77 ug/dL (ref 42–135)
Saturation Ratios: 23 % (ref 20–55)
TIBC: 335 ug/dL (ref 215–435)

## 2013-10-10 LAB — POCT HEMOGLOBIN-HEMACUE: Hemoglobin: 12.6 g/dL — ABNORMAL LOW (ref 13.0–17.0)

## 2013-10-10 LAB — FERRITIN: Ferritin: 36 ng/mL (ref 22–322)

## 2013-10-10 MED ORDER — EPOETIN ALFA 20000 UNIT/ML IJ SOLN: 15000.0000 [IU] | INTRAMUSCULAR | Status: DC

## 2013-10-20 ENCOUNTER — Encounter: Payer: Self-pay | Admitting: Internal Medicine

## 2013-10-20 ENCOUNTER — Ambulatory Visit (INDEPENDENT_AMBULATORY_CARE_PROVIDER_SITE_OTHER): Payer: Medicare Other | Admitting: Internal Medicine

## 2013-10-20 VITALS — BP 150/80 | HR 71 | Temp 98.0°F | Ht 66.0 in | Wt 176.0 lb

## 2013-10-20 DIAGNOSIS — R0602 Shortness of breath: Secondary | ICD-10-CM

## 2013-10-20 DIAGNOSIS — J8410 Pulmonary fibrosis, unspecified: Secondary | ICD-10-CM

## 2013-10-20 MED ORDER — PANTOPRAZOLE SODIUM 40 MG PO TBEC: 40.0000 mg | DELAYED_RELEASE_TABLET | Freq: Every day | ORAL | Status: AC

## 2013-10-20 MED ORDER — FAMOTIDINE 20 MG PO TABS: ORAL_TABLET | ORAL | Status: AC

## 2013-10-20 NOTE — Patient Instructions (Addendum)
Pantoprazole (protonix) 40 mg   Take 30-60 min before first meal of the day and Pepcid 20 mg one bedtime until return to office - this is the best way to tell whether stomach acid is contributing to your problem.    GERD (REFLUX)  is an extremely common cause of respiratory symptoms, many times with no significant heartburn at all.    It can be treated with medication, but also with lifestyle changes including avoidance of late meals, excessive alcohol, smoking cessation, and avoid fatty foods, chocolate, peppermint, colas, red wine, and acidic juices such as orange juice.  NO MINT OR MENTHOL PRODUCTS SO NO COUGH DROPS  USE SUGARLESS CANDY INSTEAD (jolley ranchers or Stover's)  NO OIL BASED VITAMINS - use powdered substitutes.    Please schedule a follow up office visit in 6 weeks, call sooner if needed with pfts and cxr  Late add: ask re procrit rx next ov

## 2013-10-20 NOTE — Progress Notes (Signed)
   Subjective:    Patient ID: Aaron Hahn, male    DOB: 30-Jun-1925   MRN: [id number]  HPI  [age] yowm quit smoking in 1970 referred 10/20/2013 by Dr Christell ConstantMoore for eval of ? PF  10/20/2013 1st Aaron Hahn office visit/ Aaron Hahn cc indolent onset  progressive doe x "more than a few" years to point where has trouble getting back from house assoc with dry cough and subjective wheezing. Has no exp to amiodarone or chemo or hx to suggest connective tissue dz.  No obvious day to day or daytime variabilty or assoc chronic cough or cp or chest tightness, subjective wheeze overt sinus or hb symptoms. No unusual exp hx or h/o childhood pna/ asthma or knowledge of premature birth.  Sleeping ok without nocturnal  or early am exacerbation  of respiratory  c/o's or need for noct saba. Also denies any obvious fluctuation of symptoms with weather or environmental changes or other aggravating or alleviating factors except as outlined above   Current Medications, Allergies, Complete Past Medical History, Past Surgical History, Family History, and Social History were reviewed in [facility] electronic medical record.         Review of Systems  Constitutional: Negative for fever and unexpected weight change.  HENT: Positive for sinus pressure. Negative for congestion, dental problem, ear pain, nosebleeds, postnasal drip, rhinorrhea, sneezing, sore throat and trouble swallowing.   Eyes: Negative for redness and itching.  Respiratory: Positive for cough, shortness of breath and wheezing. Negative for chest tightness.   Cardiovascular: Negative for palpitations and leg swelling.  Gastrointestinal: Negative for nausea and vomiting.  Genitourinary: Negative for dysuria.  Musculoskeletal: Negative for joint swelling.  Skin: Negative for rash.  Neurological: Negative for headaches.  Hematological: Bruises/bleeds easily.  Psychiatric/Behavioral: Negative for dysphoric mood. The patient is not nervous/anxious.        Objective:   Physical Exam   amb hoarse wm nad  Wt Readings from Last 3 Encounters:  10/20/13 176 lb (79.833 kg)  10/09/13 175 lb (79.379 kg)  05/01/13 175 lb 12.8 oz (79.742 kg)     HEENT: edentulous/ nl turbinates, and orophanx. Nl external ear canals without cough reflex   NECK :  without JVD/Nodes/TM/ nl carotid upstrokes bilaterally   LUNGS: kyphotic chest wall, slt coarsening of bs with bv features but min insp crackles    CV:  RRR  no s3 or murmur or increase in P2, no edema   ABD:  soft and nontender with nl excursion in the supine position. No bruits or organomegaly, bowel sounds nl  MS:  warm without deformities, calf tenderness, cyanosis or clubbing  SKIN: warm and dry without lesions    NEURO:  alert, approp, no deficits    cxr 10/09/13 no available      Assessment & Plan:

## 2013-10-22 NOTE — Assessment & Plan Note (Signed)
-   10/20/2013   Walked RA x one lap @ 185 stopped due to desat to 87%   Longstanding by hx and assoc with desats with exertion  DDx for pulmonary fibrosis  includes idiopathic pulmonary fibrosis, pulmonary fibrosis associated with rheumatologic diseases (which have a relatively benign course in most cases) , adverse effect from  drugs such as chemotherapy or amiodarone exposure, nonspecific interstitial pneumonia which is typically steroid responsive, and chronic hypersensitivity pneumonitis.   In active  smokers Langerhan's Cell  Histiocyctosis (eosinophilic granuomatosis),  DIP,  and Respiratory Bronchiolitis ILD also need to be considered,    Most likely this is a form of IPF we see in the very elderly that does not respond to steroids but is a less aggressive form of the disease  Use of PPI is associated with improved survival time and with decreased radiologic fibrosis per King's study published in AJRCCM vol 184 p1390.  Dec 2011  This may not be cause and effect, but given how universally unhelpful all the otherstudy drugs have been for pf,   rec start  rx ppi / diet/ lifestyle modification.

## 2013-11-06 ENCOUNTER — Other Ambulatory Visit (HOSPITAL_COMMUNITY): Payer: Self-pay | Admitting: *Deleted

## 2013-11-07 ENCOUNTER — Encounter (HOSPITAL_COMMUNITY)
Admission: RE | Admit: 2013-11-07 | Discharge: 2013-11-07 | Disposition: A | Discharge status: Home / Self Care | Payer: Medicare Other | Source: Ambulatory Visit | Attending: Nephrology | Admitting: Nephrology

## 2013-11-07 DIAGNOSIS — N184 Chronic kidney disease, stage 4 (severe): Secondary | ICD-10-CM | POA: Insufficient documentation

## 2013-11-07 DIAGNOSIS — D638 Anemia in other chronic diseases classified elsewhere: Secondary | ICD-10-CM | POA: Insufficient documentation

## 2013-11-07 LAB — IRON AND TIBC
Iron: 100 ug/dL (ref 42–135)
SATURATION RATIOS: 28 % (ref 20–55)
TIBC: 356 ug/dL (ref 215–435)
UIBC: 256 ug/dL (ref 125–400)

## 2013-11-07 LAB — POCT HEMOGLOBIN-HEMACUE: HEMOGLOBIN: 13.1 g/dL (ref 13.0–17.0)

## 2013-11-07 LAB — FERRITIN: FERRITIN: 32 ng/mL (ref 22–322)

## 2013-11-07 MED ORDER — FERUMOXYTOL INJECTION 510 MG/17 ML
INTRAVENOUS | Status: AC
  Administered 2013-11-07: 09:00:00 510 mg via INTRAVENOUS
  Filled 2013-11-07: qty 17

## 2013-11-07 MED ORDER — EPOETIN ALFA 10000 UNIT/ML IJ SOLN: 10000.0000 [IU] | INTRAMUSCULAR | Status: DC

## 2013-11-07 MED ORDER — SODIUM CHLORIDE 0.9 % IV SOLN
Freq: Once | INTRAVENOUS | Status: AC
  Administered 2013-11-07: 09:00:00 via INTRAVENOUS

## 2013-11-07 MED ORDER — FERUMOXYTOL INJECTION 510 MG/17 ML
510.0000 mg | Freq: Once | INTRAVENOUS | Status: AC
  Administered 2013-11-07: 510 mg via INTRAVENOUS

## 2013-11-13 ENCOUNTER — Ambulatory Visit (INDEPENDENT_AMBULATORY_CARE_PROVIDER_SITE_OTHER): Payer: Medicare Other | Admitting: Family Medicine

## 2013-11-13 ENCOUNTER — Encounter: Payer: Self-pay | Admitting: Family Medicine

## 2013-11-13 VITALS — BP 88/55 | HR 87 | Temp 97.0°F | Ht 66.0 in | Wt 171.0 lb

## 2013-11-13 DIAGNOSIS — J069 Acute upper respiratory infection, unspecified: Secondary | ICD-10-CM

## 2013-11-13 MED ORDER — AZITHROMYCIN 250 MG PO TABS: ORAL_TABLET | ORAL | Status: DC

## 2013-11-13 NOTE — Progress Notes (Signed)
   Subjective:    Patient ID: Aaron Hahn, male    DOB: [birth year], 80 y.o.   MRN: [id number]  HPI Patient here today for cough and congestion. Yellow sputum     Patient Active Problem List   Diagnosis Date Noted  . History of gout 10/09/2013  . Pulmonary fibrosis 10/09/2013  . CAROTID ARTERY DISEASE 05/06/2010  . DYSLIPIDEMIA 06/18/2009  . HYPERTENSION 06/18/2009  . NEPHROSCLEROSIS 06/18/2009  . CAD 06/18/2009  . PUD 06/18/2009  . GASTROINTESTINAL HEMORRHAGE 06/18/2009  . RENAL INSUFFICIENCY 06/18/2009  . PARESTHESIA 06/18/2009  . BENIGN PROSTATIC HYPERTROPHY, HX OF 06/18/2009   Outpatient Encounter Prescriptions as of 11/13/2013  Medication Sig  . allopurinol (ZYLOPRIM) 100 MG tablet Take 1 tablet (100 mg total) by mouth 2 (two) times daily. 1 TAB TWICE A DAY  . aspirin 81 MG tablet Take 81 mg by mouth daily.    Marland Kitchen. atorvastatin (LIPITOR) 80 MG tablet Take 1 tablet (80 mg total) by mouth daily. Take 1 tablet on Monday, Wednesday and Friday and take 1/2 tablet on all other days  . calcitRIOL (ROCALTROL) 0.5 MCG capsule Take 1 capsule (0.5 mcg total) by mouth daily.  Marland Kitchen. Epoetin Alfa (PROCRIT IJ) Inject as directed. 1 SHOT EVERY EVERY 2 - 3 WEEKS   . famotidine (PEPCID) 20 MG tablet One at bedtime  . furosemide (LASIX) 40 MG tablet Take 1 tablet (40 mg total) by mouth 2 (two) times daily.  . metoprolol succinate (TOPROL-XL) 25 MG 24 hr tablet Take 1 tablet (25 mg total) by mouth 2 (two) times daily.  . pantoprazole (PROTONIX) 40 MG tablet Take 1 tablet (40 mg total) by mouth daily. Take 30-60 min before first meal of the day    Review of Systems  Constitutional: Negative.   HENT: Positive for congestion.   Eyes: Negative.   Respiratory: Positive for cough.   Cardiovascular: Negative.   Gastrointestinal: Negative.   Endocrine: Negative.   Genitourinary: Negative.   Musculoskeletal: Negative.   Skin: Negative.   Allergic/Immunologic: Negative.   Neurological: Negative.     Hematological: Negative.   Psychiatric/Behavioral: Negative.        Objective:   Physical Exam  Nursing note and vitals reviewed. Constitutional: He is oriented to person, place, and time. He appears well-developed and well-nourished. No distress.  HENT:  Head: Normocephalic.  Eyes: Conjunctivae and EOM are normal. Pupils are equal, round, and reactive to light. Right eye exhibits no discharge. Left eye exhibits no discharge. No scleral icterus.  Neck: Normal range of motion. Neck supple.  Cardiovascular: Normal rate, regular rhythm and normal heart sounds.   Pulmonary/Chest: Effort normal and breath sounds normal. No respiratory distress. He has no wheezes. He has no rales.  Dry cough  Musculoskeletal: Normal range of motion.  Neurological: He is alert and oriented to person, place, and time.  Skin: Skin is warm and dry. No rash noted.  Psychiatric: He has a normal mood and affect. His behavior is normal. Judgment normal.   BP 88/55  Pulse 87  Temp(Src) 97 F (36.1 C) (Oral)  Ht 5\' 6"  (1.676 m)  Wt 171 lb (77.565 kg)  BMI 27.61 kg/m2        Assessment & Plan:  1. URI (upper respiratory infection) -Zithromax x5 day-called in to patient's pharmacy   Patient Instructions  Drink plenty of fluids  Take mucinex as directed Take antibiotic as directed.   Nyra Capeson W. Kaysea Raya MD

## 2013-11-13 NOTE — Patient Instructions (Signed)
Drink plenty of fluids  Take mucinex as directed Take antibiotic as directed.

## 2013-11-22 ENCOUNTER — Ambulatory Visit: Payer: Medicare Other | Admitting: Cardiology

## 2013-11-30 ENCOUNTER — Other Ambulatory Visit: Payer: Self-pay | Admitting: Internal Medicine

## 2013-11-30 DIAGNOSIS — R0602 Shortness of breath: Secondary | ICD-10-CM

## 2013-12-01 ENCOUNTER — Ambulatory Visit (INDEPENDENT_AMBULATORY_CARE_PROVIDER_SITE_OTHER): Payer: Medicare Other | Admitting: Internal Medicine

## 2013-12-01 ENCOUNTER — Ambulatory Visit (INDEPENDENT_AMBULATORY_CARE_PROVIDER_SITE_OTHER)
Admission: RE | Admit: 2013-12-01 | Discharge: 2013-12-01 | Disposition: A | Discharge status: Home / Self Care | Payer: Medicare Other | Source: Ambulatory Visit | Attending: Internal Medicine | Admitting: Internal Medicine

## 2013-12-01 ENCOUNTER — Encounter: Payer: Self-pay | Admitting: Internal Medicine

## 2013-12-01 ENCOUNTER — Other Ambulatory Visit: Payer: Self-pay | Admitting: Internal Medicine

## 2013-12-01 VITALS — BP 130/82 | HR 93 | Ht 66.0 in

## 2013-12-01 DIAGNOSIS — R0602 Shortness of breath: Secondary | ICD-10-CM

## 2013-12-01 DIAGNOSIS — J8410 Pulmonary fibrosis, unspecified: Secondary | ICD-10-CM

## 2013-12-01 DIAGNOSIS — J9610 Chronic respiratory failure, unspecified whether with hypoxia or hypercapnia: Secondary | ICD-10-CM

## 2013-12-01 LAB — PULMONARY FUNCTION TEST
DL/VA % pred: 75 %
DL/VA: 3.23 ml/min/mmHg/L
DLCO UNC % PRED: 32 %
DLCO UNC: 8.84 ml/min/mmHg
FEF 25-75 POST: 1.27 L/s
FEF 25-75 Pre: 1.58 L/sec
FEF2575-%CHANGE-POST: -19 %
FEF2575-%PRED-POST: 99 %
FEF2575-%Pred-Pre: 123 %
FEV1-%Change-Post: -2 %
FEV1-%PRED-POST: 64 %
FEV1-%PRED-PRE: 65 %
FEV1-POST: 1.35 L
FEV1-Pre: 1.38 L
FEV1FVC-%CHANGE-POST: -2 %
FEV1FVC-%Pred-Pre: 116 %
FEV6-%Change-Post: -1 %
FEV6-%Pred-Post: 59 %
FEV6-%Pred-Pre: 60 %
FEV6-Post: 1.68 L
FEV6-Pre: 1.69 L
FEV6FVC-%Pred-Post: 108 %
FEV6FVC-%Pred-Pre: 108 %
FVC-%CHANGE-POST: 0 %
FVC-%PRED-POST: 55 %
FVC-%Pred-Pre: 55 %
FVC-Post: 1.7 L
FVC-Pre: 1.7 L
POST FEV1/FVC RATIO: 79 %
PRE FEV1/FVC RATIO: 81 %
Post FEV6/FVC ratio: 100 %
Pre FEV6/FVC Ratio: 100 %

## 2013-12-01 NOTE — Patient Instructions (Signed)
Continue prednisone 20 mg per day until able to walk in hallway on 02 comfortably and only then try the 10 mg dose  Please schedule a follow up office visit in 6 weeks, call sooner if needed with all medications in hand or an accurate list of them

## 2013-12-01 NOTE — Progress Notes (Signed)
Subjective:    Patient ID: Aaron Hahn, male    DOB: 01/30/1925   MRN: [id number]    Brief patient profile:  43 yowm quit smoking in 1970 referred 10/20/2013 by Dr Vernon Prey for eval of ? PF   History of Present Illness  10/20/2013 1st [facility] office visit/ Wert cc indolent onset  progressive doe x "more than a few" years to point where has trouble getting back from house after walking to get paper (? About 50 ft) assoc with dry cough and subjective wheezing. Has no exp to amiodarone or chemo or hx to suggest connective tissue dz. Rec Pantoprazole (protonix) 40 mg   Take 30-60 min before first meal of the day and Pepcid 20 mg one bedtime until return to office - this is the best way to tell whether stomach acid is contributing to your problem.   GERD diet   12/01/2013 f/u ov/Wert re: PF/ started on steroids since last ov ? Date  Chief Complaint  Patient presents with  . Shortness of Breath    Breathing is unchanged. Reports slight SOB and coughing. Denies chest tightness or wheezing. Had PFT and CXR done today.   started on prednisone w/in last sev weeks and helping his breathing and gout so far  No obvious day to day or daytime variabilty or assoc  cp or chest tightness, subjective wheeze overt sinus or hb symptoms. No unusual exp hx or h/o childhood pna/ asthma or knowledge of premature birth.  Sleeping ok without nocturnal  or early am exacerbation  of respiratory  c/o's or need for noct saba. Also denies any obvious fluctuation of symptoms with weather or environmental changes or other aggravating or alleviating factors except as outlined above   Current Medications, Allergies, Complete Past Medical History, Past Surgical History, Family History, and Social History were reviewed in [employer] record.  ROS  The following are not active complaints unless bolded sore throat, dysphagia, dental problems, itching, sneezing,  nasal congestion or excess/  purulent secretions, ear ache,   fever, chills, sweats, unintended wt loss, pleuritic or exertional cp, hemoptysis,  orthopnea pnd or leg swelling, presyncope, palpitations, heartburn, abdominal pain, anorexia, nausea, vomiting, diarrhea  or change in bowel or urinary habits, change in stools or urine, dysuria,hematuria,  rash, arthralgias, visual complaints, headache, numbness weakness or ataxia or problems with walking or coordination,  change in mood/affect or memory.           Objective:   Physical Exam   amb hoarse wm nad now w/c bound on 4lpm NP  Wt Readings from Last 3 Encounters:  11/13/13 171 lb (77.565 kg)  11/07/13 180 lb (81.647 kg)  10/20/13 176 lb (79.833 kg)        HEENT: edentulous/ nl turbinates, and orophanx. Nl external ear canals without cough reflex   NECK :  without JVD/Nodes/TM/ nl carotid upstrokes bilaterally   LUNGS: kyphotic chest wall, slt coarsening of bs with bv features but min insp crackles    CV:  RRR  no s3 or murmur or increase in P2, no edema   ABD:  soft and nontender with nl excursion in the supine position. No bruits or organomegaly, bowel sounds nl  MS:  warm without deformities, calf tenderness, cyanosis or clubbing  SKIN: warm and dry without lesions           CXR  12/01/2013 : there is bilateral reticular interstitial prominence suspicious for chronic interstitial lung disease and  fibrotic changes. I cannot exclude superimposed airspace disease lung bases and left upper lobe. No convincing pulmonary edema.      Assessment & Plan:

## 2013-12-01 NOTE — Progress Notes (Signed)
PFT done today. 

## 2013-12-02 DIAGNOSIS — J9610 Chronic respiratory failure, unspecified whether with hypoxia or hypercapnia: Secondary | ICD-10-CM | POA: Insufficient documentation

## 2013-12-02 NOTE — Assessment & Plan Note (Signed)
-   10/20/2013   Walked RA x one lap @ 185 stopped due to desat to 87%  - Placed on steroids 10/2013 - VC 1.66 (53%) with no obst and DLCO 32% corrects to 75  Seems to be better on steroids so may have an inflammatory component > The goal with a chronic steroid dependent illness is always arriving at the lowest effective dose that controls the disease/symptoms and not accepting a set "formula" which is based on statistics or guidelines that don't always take into account patient  variability or the natural hx of the dz in every individual patient, which may well vary over time.  For now therefore I recommend the patient maintain  Ceiling of 20 mg and a floor of 10 mg per day for now

## 2013-12-02 NOTE — Assessment & Plan Note (Signed)
-   [facility]  Adequate control on present rx, reviewed > no change in rx needed

## 2013-12-04 ENCOUNTER — Telehealth: Payer: Self-pay | Admitting: Internal Medicine

## 2013-12-04 NOTE — Telephone Encounter (Signed)
lmomtcb x1 

## 2013-12-05 ENCOUNTER — Encounter (HOSPITAL_COMMUNITY): Payer: Medicare Other

## 2013-12-07 NOTE — Telephone Encounter (Signed)
Called and was advised to call Rosey Batheresa tomorrow as she is out today.

## 2013-12-08 NOTE — Telephone Encounter (Signed)
I spoke with Aaron DroughtErica and apparently this problem has already been resolved. Nothing further is needed.

## 2013-12-20 ENCOUNTER — Ambulatory Visit: Payer: Medicare Other | Admitting: Cardiology

## 2013-12-22 ENCOUNTER — Ambulatory Visit: Payer: Medicare Other | Admitting: Cardiology

## 2014-01-12 ENCOUNTER — Ambulatory Visit: Payer: Medicare Other | Admitting: Internal Medicine

## 2014-01-16 ENCOUNTER — Encounter (HOSPITAL_COMMUNITY): Payer: Medicare Other

## 2014-01-18 ENCOUNTER — Ambulatory Visit: Payer: Medicare Other | Admitting: Internal Medicine

## 2014-01-20 IMAGING — CR DG CHEST 2V
2 series · 2 of 2 positions shown · non-contrast
Comparison: CT scan of May 26, 2007.

CLINICAL DATA: Enlarged heart.

CHEST - 2 VIEW

[view not recorded (1 of 2)]
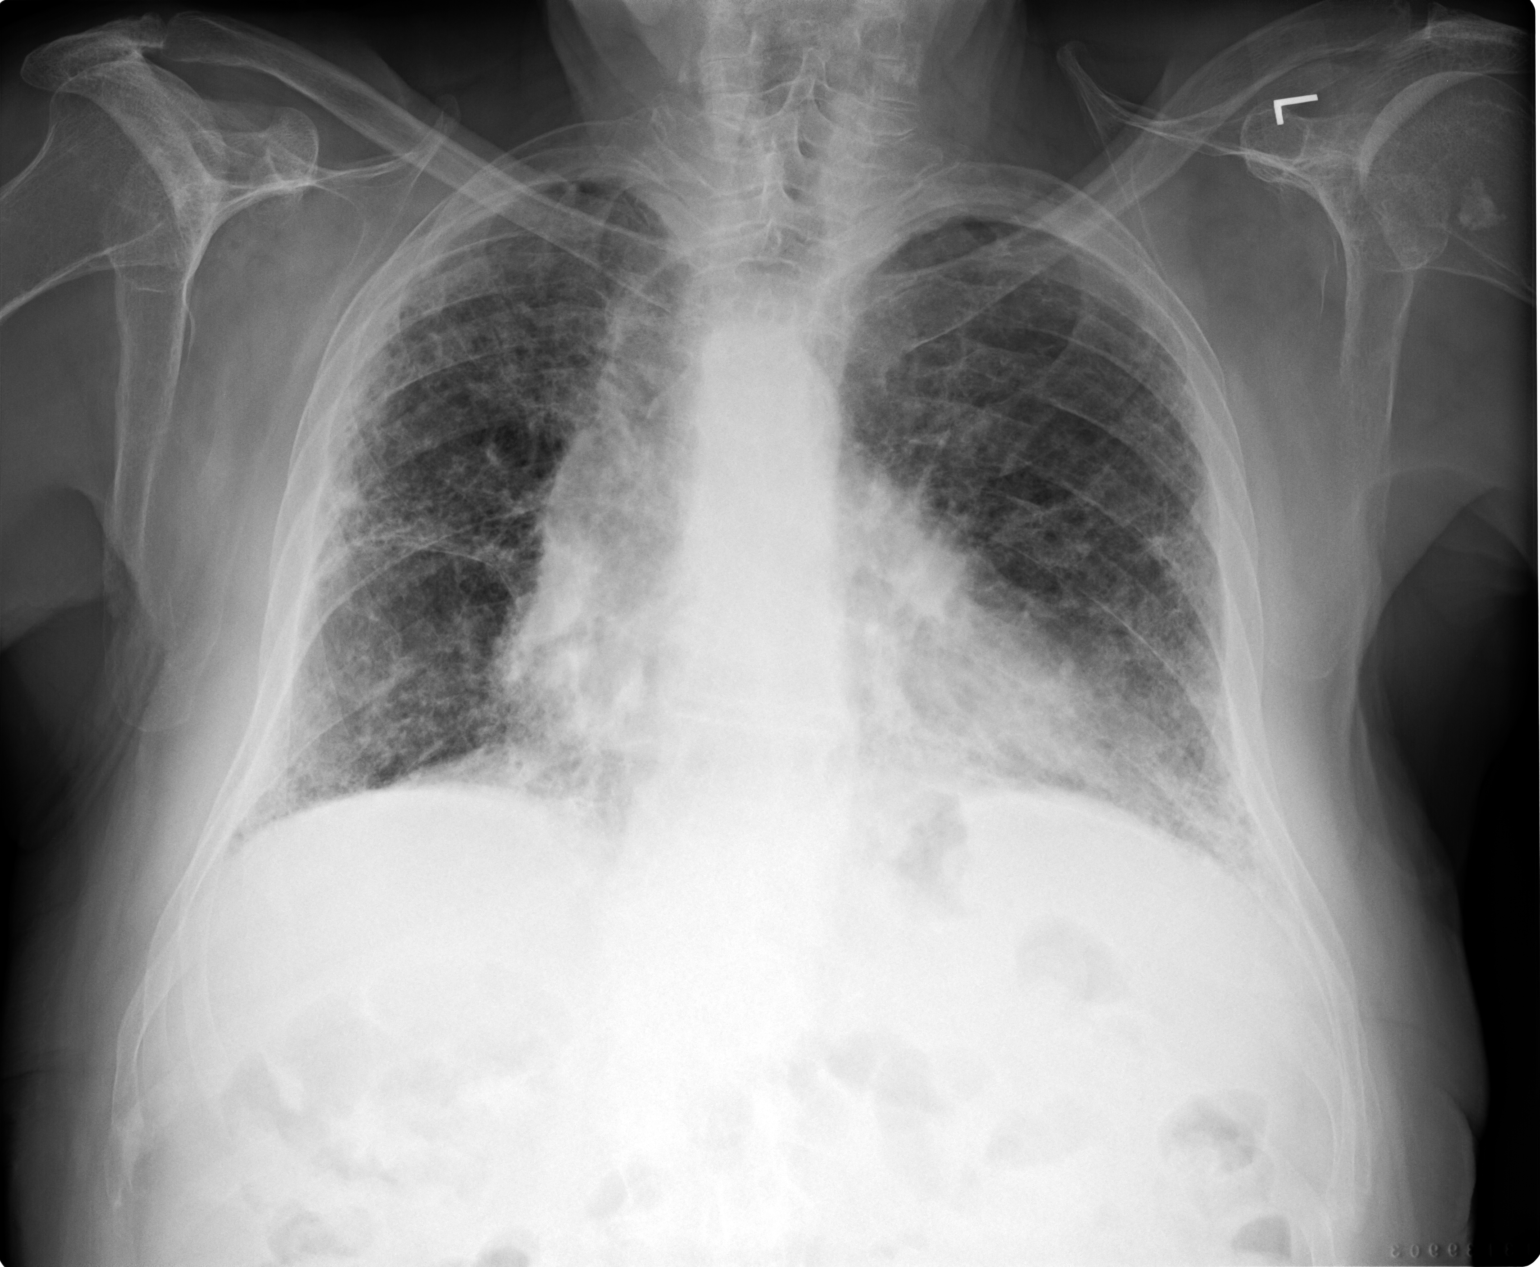

[view not recorded (2 of 2)]
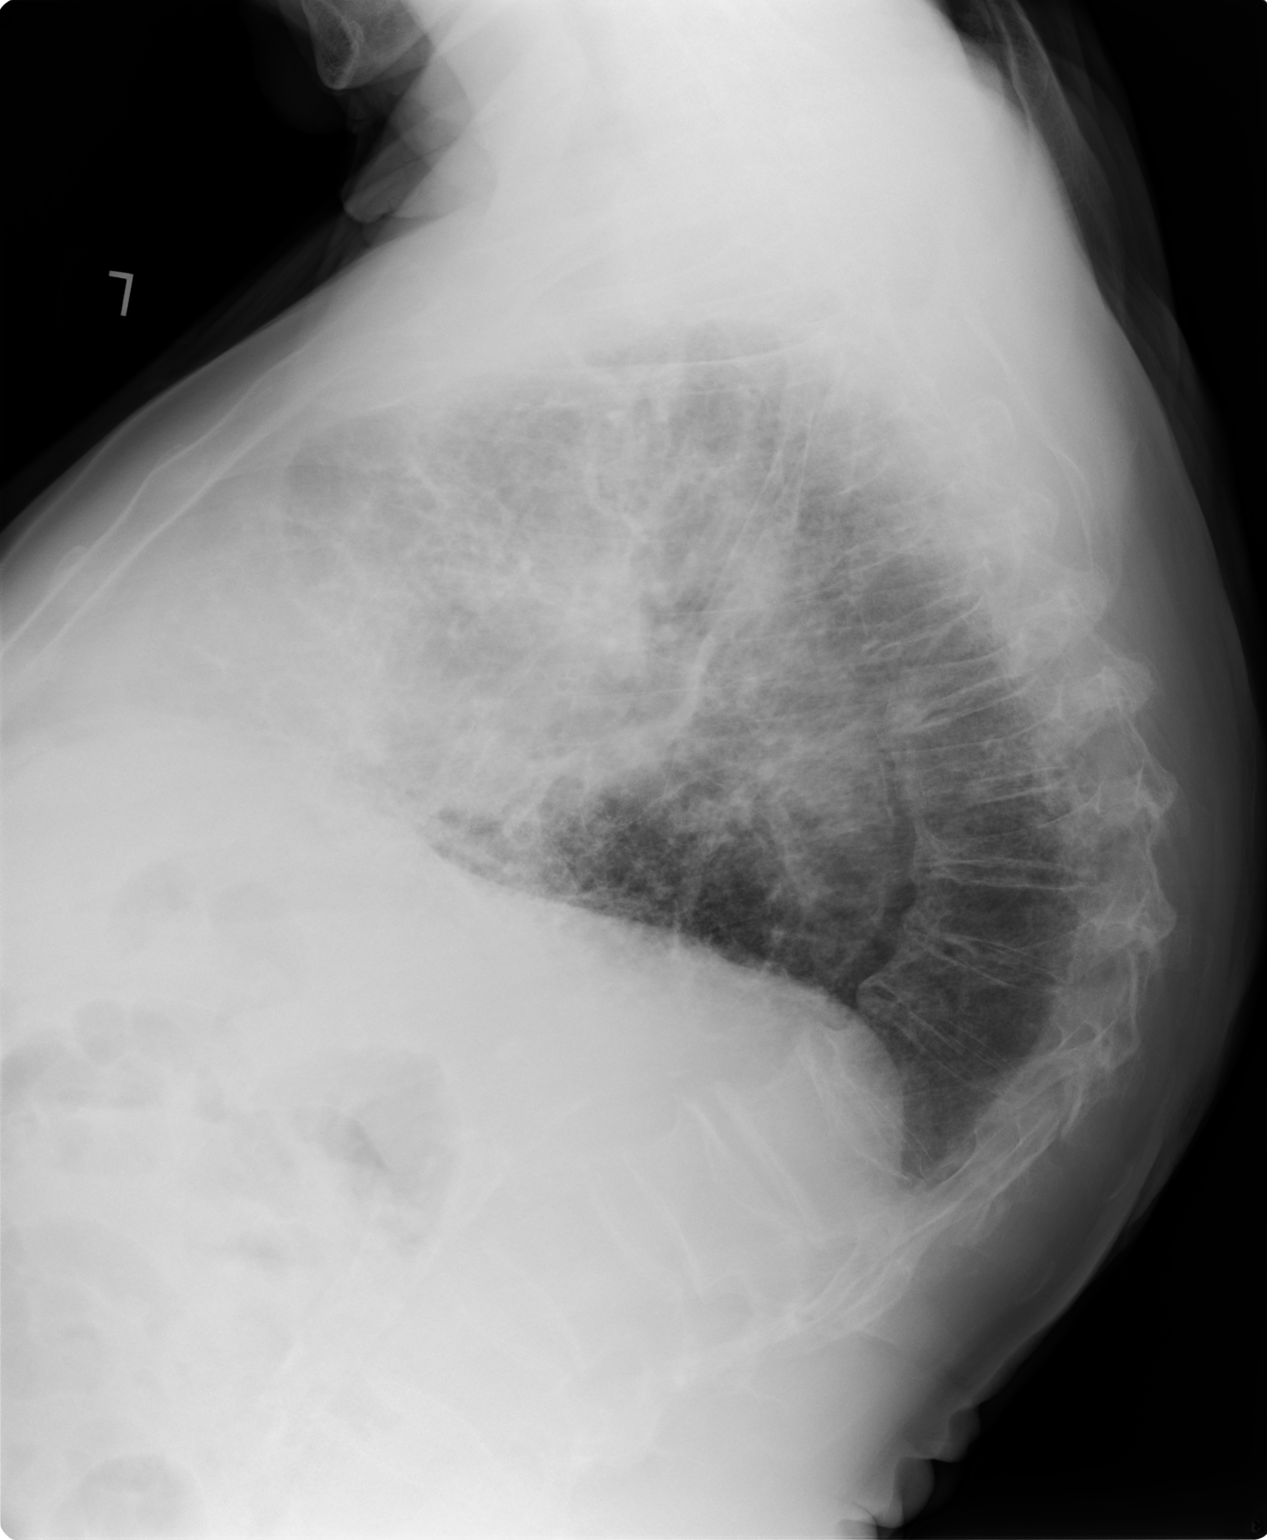

[2 of 2 positions shown; findings below may reference images not displayed]

FINDINGS: Moderate thoracic kyphosis is noted.  Cardiomediastinal
silhouette appears normal.  Diffuse interstitial reticular
densities are noted throughout both lungs most consistent with
chronic lung disease or pulmonary fibrosis.
IMPRESSION: Extensive interstitial reticular densities are noted throughout
both lungs consistent with chronic interstitial lung disease or
pulmonary fibrosis.

## 2014-01-20 IMAGING — CR DG CERVICAL SPINE COMPLETE 4+V
5 series · 5 of 5 positions shown · non-contrast
Comparison: None.

CLINICAL DATA: History of pain in the left shoulder.

CERVICAL SPINE - COMPLETE 4+ VIEW

[view not recorded (1 of 5)]
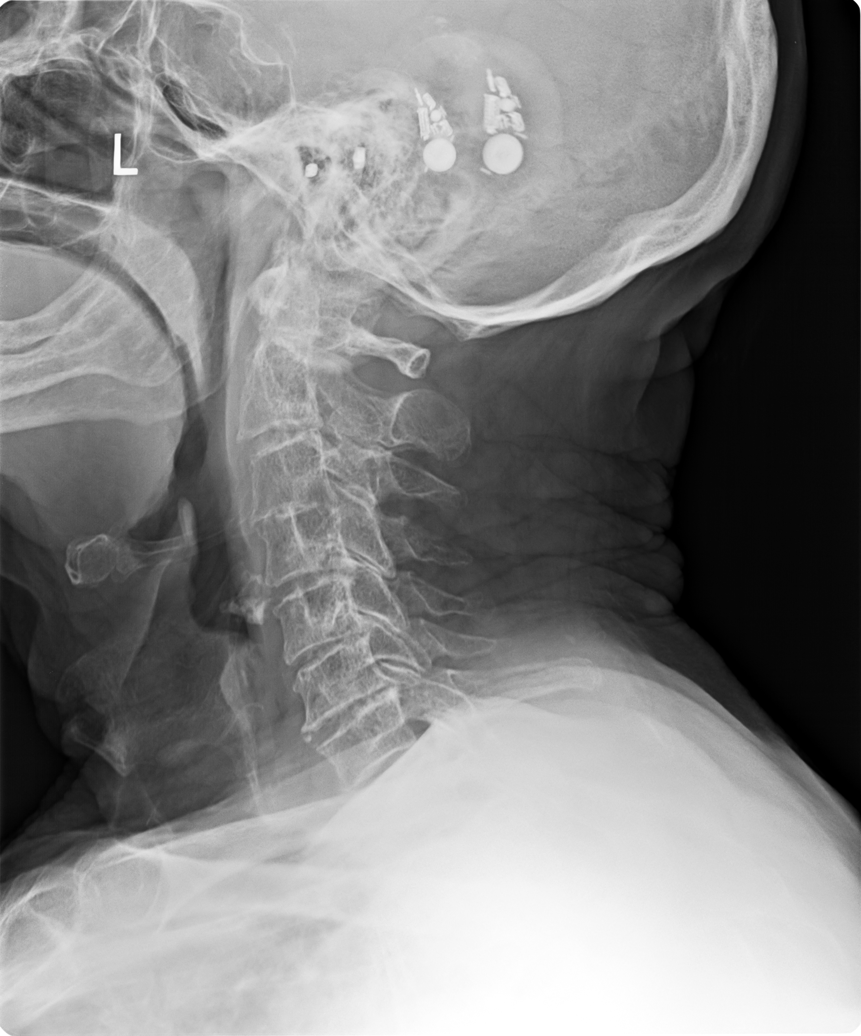

[view not recorded (2 of 5)]
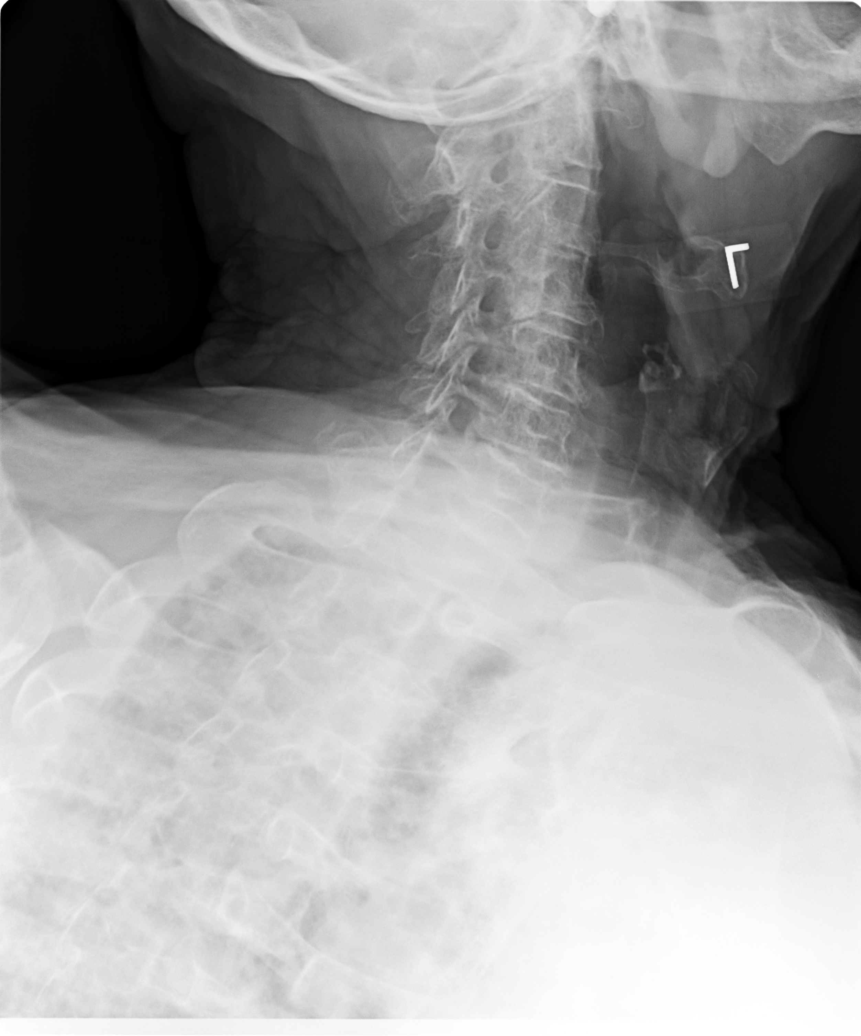

[view not recorded (3 of 5)]
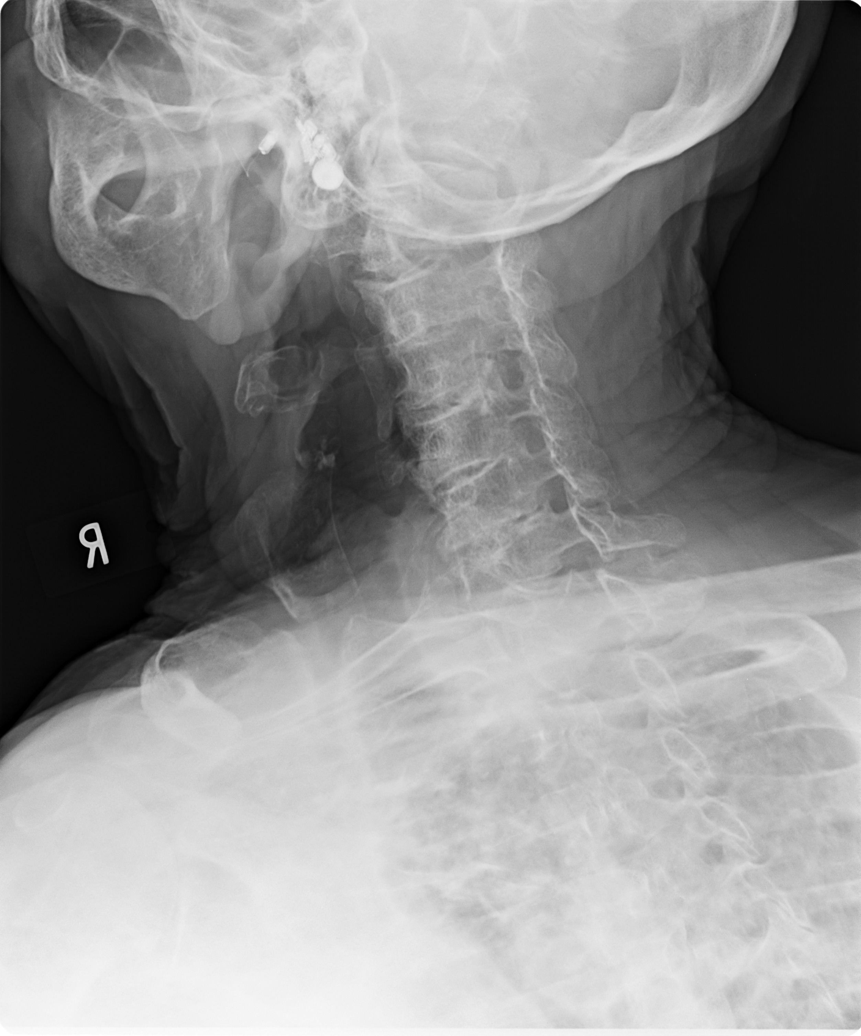

[view not recorded (4 of 5)]
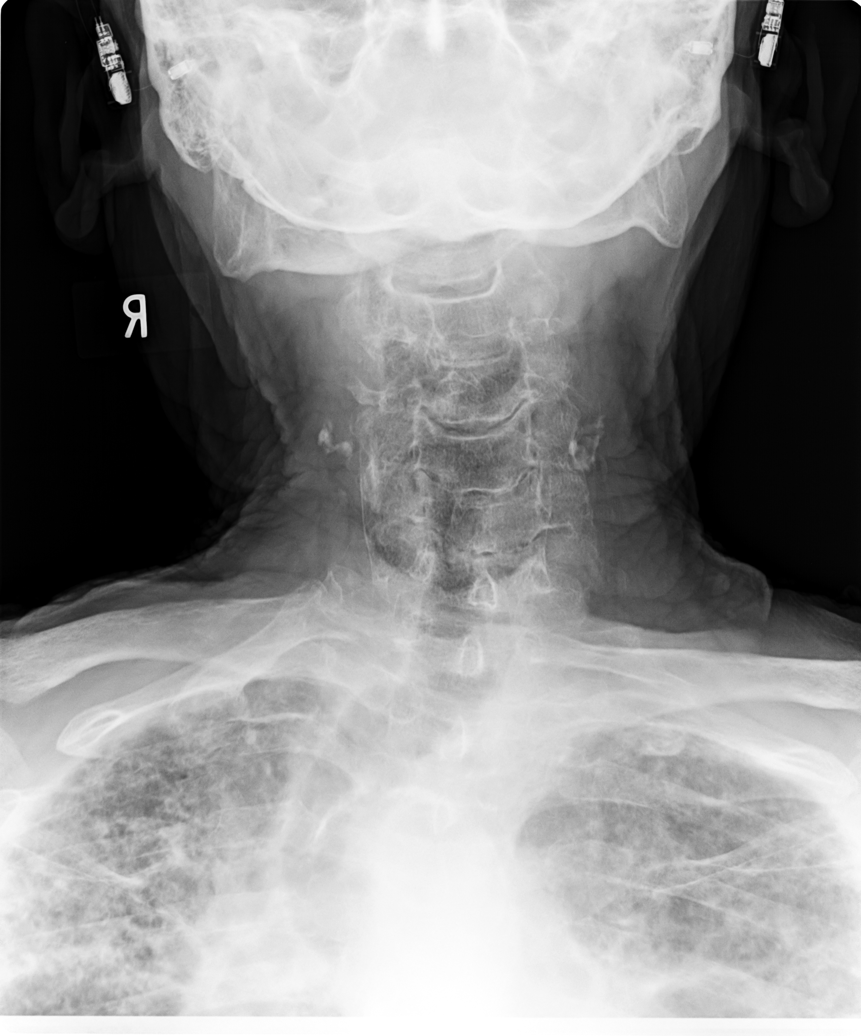

[view not recorded (5 of 5)]
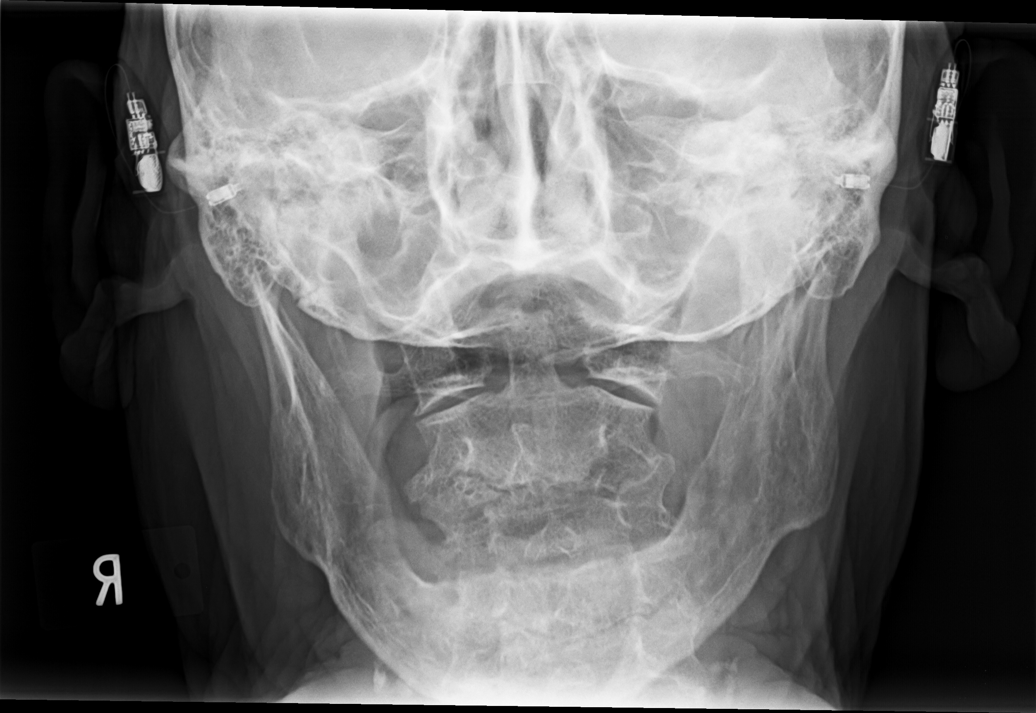

[5 of 5 positions shown; findings below may reference images not displayed]

FINDINGS: No prevertebral soft tissue swelling is evident.  There
is osteopenic appearance of bones.  There is narrowing of
intervertebral disc spaces at multiple levels.  There is multilevel
marginal osteophyte formation.  There is posterior bony ridging and
spurring at the levels of C2-C3, C4-C5, C5-C6 and C6-C7.  No
fracture or bony destruction is seen.  There is slight foraminal
encroachment by spurring at the levels of C4-C5, C5-C6 and C6-C7
more on the left than the right.  No fracture or bony destruction
is evident.  There is apophyseal joint degenerative spondylosis as
well in the mid and lower cervical spine.  On the AP image
paravertebral calcifications are seen in the soft tissues on each
side.  These may be vascular.  No cervical rib is demonstrated.
IMPRESSION: Changes of degenerative disc disease and degenerative spondylosis
are present at multiple levels are detailed above.  Osteopenic
appearance of bones. On the AP image paravertebral calcifications
are seen in the soft tissues on each side.  These may be vascular.

## 2014-01-23 ENCOUNTER — Telehealth: Payer: Self-pay | Admitting: Family Medicine

## 2014-01-24 ENCOUNTER — Ambulatory Visit: Payer: Medicare Other | Admitting: Family Medicine

## 2014-01-24 NOTE — Telephone Encounter (Signed)
FYI

## 2014-01-25 ENCOUNTER — Telehealth: Payer: Self-pay | Admitting: Internal Medicine

## 2014-01-25 NOTE — Telephone Encounter (Signed)
Will forward to MW as an FYI 

## 2014-01-31 ENCOUNTER — Encounter (HOSPITAL_COMMUNITY): Payer: Medicare Other

## 2014-02-14 ENCOUNTER — Ambulatory Visit: Payer: Medicare Other | Admitting: Cardiology

## 2014-02-16 DEATH — deceased
